# Patient Record
Sex: Female | Born: 1950 | Race: Black or African American | Hispanic: No | Marital: Married | Smoking: Former smoker
Health system: Southern US, Community
[De-identification: ages and names within clinical notes are randomized; demographics above are authoritative.]

## PROBLEM LIST (undated history)

## (undated) DIAGNOSIS — E119 Type 2 diabetes mellitus without complications: Secondary | ICD-10-CM

## (undated) DIAGNOSIS — K219 Gastro-esophageal reflux disease without esophagitis: Secondary | ICD-10-CM

## (undated) DIAGNOSIS — J449 Chronic obstructive pulmonary disease, unspecified: Secondary | ICD-10-CM

## (undated) DIAGNOSIS — I10 Essential (primary) hypertension: Secondary | ICD-10-CM

## (undated) DIAGNOSIS — IMO0001 Reserved for inherently not codable concepts without codable children: Secondary | ICD-10-CM

## (undated) DIAGNOSIS — K289 Gastrojejunal ulcer, unspecified as acute or chronic, without hemorrhage or perforation: Secondary | ICD-10-CM

## (undated) HISTORY — PX: BREAST SURGERY: SHX581

---

## 2006-03-26 ENCOUNTER — Other Ambulatory Visit: Admission: RE | Admit: 2006-03-26 | Discharge: 2006-03-26 | Payer: Self-pay | Admitting: Gynecology

## 2007-08-25 ENCOUNTER — Other Ambulatory Visit: Admission: RE | Admit: 2007-08-25 | Discharge: 2007-08-25 | Payer: Self-pay | Admitting: Gynecology

## 2009-02-03 ENCOUNTER — Other Ambulatory Visit: Admission: RE | Admit: 2009-02-03 | Discharge: 2009-02-03 | Payer: Self-pay | Admitting: Gynecology

## 2009-02-03 ENCOUNTER — Ambulatory Visit: Payer: Self-pay | Admitting: Gynecology

## 2009-02-27 ENCOUNTER — Encounter: Payer: Self-pay | Admitting: Internal Medicine

## 2009-03-03 ENCOUNTER — Ambulatory Visit: Payer: Self-pay | Admitting: Internal Medicine

## 2009-03-03 DIAGNOSIS — I10 Essential (primary) hypertension: Secondary | ICD-10-CM | POA: Insufficient documentation

## 2009-03-03 DIAGNOSIS — J449 Chronic obstructive pulmonary disease, unspecified: Secondary | ICD-10-CM

## 2009-03-03 DIAGNOSIS — J45909 Unspecified asthma, uncomplicated: Secondary | ICD-10-CM | POA: Insufficient documentation

## 2009-03-03 DIAGNOSIS — J4489 Other specified chronic obstructive pulmonary disease: Secondary | ICD-10-CM | POA: Insufficient documentation

## 2009-03-29 ENCOUNTER — Ambulatory Visit: Payer: Self-pay | Admitting: Gynecology

## 2009-04-07 ENCOUNTER — Ambulatory Visit: Payer: Self-pay | Admitting: Internal Medicine

## 2009-07-12 ENCOUNTER — Encounter: Admission: RE | Admit: 2009-07-12 | Discharge: 2009-07-12 | Payer: Self-pay | Admitting: *Deleted

## 2010-02-06 NOTE — Miscellaneous (Signed)
Summary: Orders Update pft charges  Clinical Lists Changes  Orders: Added new Service order of Carbon Monoxide diffusing w/capacity (94720) - Signed Added new Service order of Lung Volumes (94240) - Signed Added new Service order of Spirometry (Pre & Post) (94060) - Signed 

## 2010-02-06 NOTE — Assessment & Plan Note (Signed)
Summary: Pulmonary/ f/u pft's with hfa @ 90% effective now   Copy to:  [facility]  CC:  F/U with PFT - SOB is much better - Denies cough - Sleeping better at hs .  History of Present Illness: 60 yobf quit smoking Feb 19 2008 due to shortness of breath and dx copd based by [facility] DC doctor 2005 with best days able to do treadmill until January 2011  March 03, 2009 cc worse breathing x 1 month to [facility] Urgent care and got shots, abx and albuterol nebulizer some better then worse, went back and repeated rx some better but problem waking up early am with fatigue cough and congestion and marked decrease activity tol to point where sob across parking lot now.   Stop pulmicort (budesonide) only use nebulizer with albuterol if needed but if needed could use it up to every 4 hours start dulera 200 2 puffs first thing  in am and 2 puffs again in pm about 12 hours later   April 07, 2009 F/U with PFT - SOB is much better - Denies cough - Sleeping better at hs. Pt denies any significant sore throat, dysphagia, itching, sneezing,  nasal congestion or excess secretions,  fever, chills, sweats, unintended wt loss, pleuritic or exertional cp, hempoptysis, change in activity tolerance  orthopnea pnd or leg swelling. Pt also denies any obvious fluctuation in symptoms with weather or environmental change or other alleviating or aggravating factors.     Pt denies any increase in rescue therapy over baseline, denies waking up needing it or having early am exacerbations of coughing/wheezing/ or dyspnea   Current Medications (verified): 1)  Hydrochlorothiazide 25 Mg Tabs (Hydrochlorothiazide) .Marland Kitchen.. 1 Once Daily 2)  Dulera 200-5 Mcg/act Aero (Mometasone Furo-Formoterol Fum) .... 2 Puffs First Thing  in Am and 2 Puffs Again in Pm About 12 Hours Later  Allergies (verified): No Known Drug Allergies  Past History:  Past Medical History:  Elisabeth Pigeon D     - HFA 50 % March 03, 2009  > 90%  April 07, 2009      - PFT's April 07, 2009 FEV1 1.38 (61%) ratio 46 with 10% better after B2 DLC0 52 > 89% corrected Hypertension  Vital Signs:  Patient profile:   60 year old female Height:      63 inches Weight:      176 pounds BMI:     31.29 O2 Sat:      96 % on Room air Temp:     97.7 degrees F oral Pulse rate:   83 / minute BP sitting:   136 / 84  (left arm) Cuff size:   regular  Vitals Entered By: Abigail Miyamoto RN (April 07, 2009 10:41 AM)  O2 Flow:  Room air  Physical Exam  Additional Exam:  amb pleasant mod obese wf nad  wt 177  March 03, 2009 > 176 April 07, 2009  HEENT mild turbinate edema.  Oropharynx no thrush or excess pnd or cobblestoning.  No JVD or cervical adenopathy. Mild accessory muscle hypertrophy. Trachea midline, nl thryroid. Chest was hyperinflated by percussion with diminished breath sounds and moderate increased exp time without wheeze. Hoover sign positive at mid inspiration. Regular rate and rhythm without murmur gallop or rub or increase P2 or edema.  Abd: no hsm, nl excursion. Ext warm without cyanosis or clubbing.     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  I spent extra time with the patient today explaining optimal mdi  technique.  This improved from  75 - 90%   Each maintenance medication was reviewed in detail including most importantly the difference between maintenance and as needed and under what circumstances the prns are to be used. See instructions for specific recommendations   Other Orders: Est. Patient Level III ([address]) HFA Instruction [phone])  Patient Instructions: 1)   for cough is called mucinex dm otc 2)  Continue dulera 200 2 puffs first thing  in am and 2 puffs again in  pm about 12 hours later if needed  3)  Return to office in 3 months, sooner if needed  Prescriptions: DULERA 200-5 MCG/ACT AERO (MOMETASONE FURO-FORMOTEROL FUM) 2 puffs first thing  in am and 2 puffs again in pm about 12 hours later  #1 x 11   Entered and Authorized by:   Nyoka Cowden MD   Signed by:   Nyoka Cowden MD on 04/08/2009   Method used:   Electronically to        [facility] [address] [phone]* (retail)       [address]       [place], [place]  [address]       Ph: [phone] or [phone]       Fax: [phone]   RxID:   [id number]

## 2010-02-06 NOTE — Letter (Signed)
Summary: Medication Hx/[facility]  Medication Hx/[facility] @ [facility]   Imported By: Sherian Rein 03/10/2009 08:12:48  _____________________________________________________________________  External Attachment:    Type:   Image     Comment:   External Document

## 2010-02-06 NOTE — Letter (Signed)
Summary: [facility]   Imported By: Sherian Rein 03/10/2009 08:15:00  _____________________________________________________________________  External Attachment:    Type:   Image     Comment:   External Document

## 2010-02-06 NOTE — Assessment & Plan Note (Signed)
Summary: Pulmonary consultation/  COPD with AB,  HFA 50% effective   Visit Type:  Initial Consult Copy to:  [facility]  CC:  Dyspnea.  History of Present Illness: 41 yowf quit smoking Feb 17 2008 due to shortness of breath and dx copd based by [facility] DC doctor 2005 with best days able to do treadmill until January 2011  March 03, 2009 cc worse breathing x 1 month to [facility] Urgent care and got shots, abx and albuterol nebulizer some better then worse, went back and repeated rx some better but problem waking up early am with fatigue cough and congestion and marked decrease activity tol to point where sob across parking lot now.   Pt denies any significant sore throat, dysphagia, itching, sneezing,  nasal congestion or excess secretions,  fever, chills, sweats, unintended wt loss, pleuritic or exertional cp, hempoptysis, change in activity tolerance  orthopnea pnd or leg swelling. Pt also denies any obvious fluctuation in symptoms with weather or environmental change or other alleviating or aggravating factors.    Pt denies any increase in rescue therapy over baseline, denies waking up needing it or having early am exacerbations of coughing/wheezing/ or dyspnea   Current Medications (verified): 1)  Medrol (Pak) 4 Mg Tabs (Methylprednisolone) .... As Directed 2)  Hydrochlorothiazide 25 Mg Tabs (Hydrochlorothiazide) .Marland Kitchen.. 1 Once Daily  Allergies (verified): No Known Drug Allergies  Past History:  Past Medical History:  C Erskin Burnet D     - HFA 50 % March 03, 2009  Hypertension  Social History: Married with children Current smoker since age 66.  Smokes 1 ppd No ETOH Unemployed  Review of Systems       The patient complains of shortness of breath with activity, shortness of breath at rest, productive cough, irregular heartbeats, acid heartburn, sneezing, itching, and joint stiffness or pain.  The patient denies non-productive cough, coughing up blood, chest pain,  indigestion, loss of appetite, weight change, abdominal pain, difficulty swallowing, sore throat, tooth/dental problems, headaches, nasal congestion/difficulty breathing through nose, ear ache, anxiety, depression, hand/feet swelling, rash, change in color of mucus, and fever.    Vital Signs:  Patient profile:   60 year old female Height:      63 inches Weight:      177.13 pounds BMI:     31.49 O2 Sat:      95 % on Room air Temp:     97.8 degrees F oral Pulse rate:   104 / minute BP sitting:   132 / 90  (left arm)  Vitals Entered By: Vernie Murders (March 03, 2009 11:40 AM)  O2 Flow:  Room air  Physical Exam  Additional Exam:  amb pleasant mod obese wf nad  wt 177  March 03, 2009  HEENT mild turbinate edema.  Oropharynx no thrush or excess pnd or cobblestoning.  No JVD or cervical adenopathy. Mild accessory muscle hypertrophy. Trachea midline, nl thryroid. Chest was hyperinflated by percussion with diminished breath sounds and moderate increased exp time without wheeze. Hoover sign positive at mid inspiration. Regular rate and rhythm without murmur gallop or rub or increase P2 or edema.  Abd: no hsm, nl excursion. Ext warm without cyanosis or clubbing.     Impression & Recommendations:  Problem # 1:  C O P D (ICD-496) Baseline ex tol excellent so this must be copd with sign AB component and should improve with aggressive rx    DDX of  difficult airways managment all start  with A and  include Adherence, Ace Inhibitors, Acid Reflux, Active Sinus Disease, Alpha 1 Antitripsin deficiency, Anxiety masquerading as Airways dz,  ABPA,  allergy(esp in young), Aspiration (esp in elderly), Adverse effects of DPI,  Active smokers, plus one B  = Beta blocker use..   Assures me active smoking resolved so rx asthma with dulera and Acid Reflux with Nexium and diet until firm baseline functional status and FEV1 reviewed on next ov.  I spent extra time with the patient today explaining optimal  mdi  technique.  This improved from  25-50% but needs more work.  Medications Added to Medication List This Visit: 1)  Medrol (pak) 4 Mg Tabs (Methylprednisolone) .... As directed 2)  Hydrochlorothiazide 25 Mg Tabs (Hydrochlorothiazide) .Marland Kitchen.. 1 once daily 3)  Nexium 40 Mg Cpdr (Esomeprazole magnesium) .... By mouth daily. take one half hour before eating. 4)  Dulera 200-5 Mcg/act Aero (Mometasone furo-formoterol fum) .... 2 puffs first thing  in am and 2 puffs again in pm about 12 hours later 5)  Pepcid Ac Maximum Strength 20 Mg Tabs (Famotidine) .... One at bedtime  Other Orders: Consultation Level V [phone])  Patient Instructions: 1)  Stop pulmicort (budesonide) 2)  only use nebulizer with albuterol if needed but if needed could use it up to every 4 hours 3)  start dulera 200 2 puffs first thing  in am and 2 puffs again in pm about 12 hours later  4)  Work on inhaler technique:  relax and blow all the way out then take a nice smooth deep breath back in, triggering the inhaler at same time you start breathing in and hold it for a few seconds 5)  start nexium before bfast every day whether you think you need it or not plus pepcid 20 mg at bedtime 6)  GERD (REFLUX)  is a common cause of respiratory symptoms. It commonly presents without heartburn and can be treated with medication, but also with lifestyle changes including avoidance of late meals, excessive alcohol, smoking cessation, and avoid fatty foods, chocolate, peppermint, colas, red wine, and acidic juices such as orange juice. NO MINT OR MENTHOL PRODUCTS SO NO COUGH DROPS  7)  USE SUGARLESS CANDY INSTEAD (jolley ranchers)  8)  NO OIL BASED VITAMINS  9)  Please schedule a follow-up appointment in 4weeks, sooner if needed with PFT's  10)  for cough is called mucinex dm otc Prescriptions: NEXIUM 40 MG  CPDR (ESOMEPRAZOLE MAGNESIUM) By mouth daily. Take one half hour before eating.  #30 x 3   Entered and Authorized by:   Nyoka Cowden  MD   Signed by:   Nyoka Cowden MD on 03/03/2009   Method used:   Electronically to        [facility]. [address] [phone]* (retail)       [address]       [place], [place]  [address]       Ph: [phone] or [phone]       Fax: [phone]   RxID:   [id number]

## 2010-07-17 ENCOUNTER — Other Ambulatory Visit: Payer: Self-pay

## 2010-07-17 ENCOUNTER — Other Ambulatory Visit (HOSPITAL_COMMUNITY)
Admission: RE | Admit: 2010-07-17 | Discharge: 2010-07-17 | Disposition: A | Discharge status: Home / Self Care | Payer: Federal, State, Local not specified - PPO | Source: Ambulatory Visit | Attending: Internal Medicine | Admitting: Internal Medicine

## 2010-07-17 DIAGNOSIS — Z01419 Encounter for gynecological examination (general) (routine) without abnormal findings: Secondary | ICD-10-CM | POA: Insufficient documentation

## 2012-02-27 ENCOUNTER — Other Ambulatory Visit: Payer: Self-pay | Admitting: Family Medicine

## 2012-02-27 ENCOUNTER — Other Ambulatory Visit (HOSPITAL_COMMUNITY)
Admission: RE | Admit: 2012-02-27 | Discharge: 2012-02-27 | Disposition: A | Discharge status: Home / Self Care | Payer: Federal, State, Local not specified - PPO | Source: Ambulatory Visit | Attending: Family Medicine | Admitting: Family Medicine

## 2012-02-27 DIAGNOSIS — Z0000 Encounter for general adult medical examination without abnormal findings: Secondary | ICD-10-CM | POA: Insufficient documentation

## 2014-08-14 ENCOUNTER — Emergency Department (HOSPITAL_COMMUNITY): Payer: Federal, State, Local not specified - PPO

## 2014-08-14 ENCOUNTER — Encounter (HOSPITAL_COMMUNITY): Payer: Self-pay | Admitting: *Deleted

## 2014-08-14 ENCOUNTER — Emergency Department (HOSPITAL_COMMUNITY)
Admission: EM | Admit: 2014-08-14 | Discharge: 2014-08-14 | Disposition: A | Discharge status: Home / Self Care | Payer: Federal, State, Local not specified - PPO | Attending: Emergency Medicine | Admitting: Emergency Medicine

## 2014-08-14 DIAGNOSIS — R1011 Right upper quadrant pain: Secondary | ICD-10-CM | POA: Diagnosis present

## 2014-08-14 DIAGNOSIS — K8020 Calculus of gallbladder without cholecystitis without obstruction: Secondary | ICD-10-CM | POA: Diagnosis not present

## 2014-08-14 DIAGNOSIS — I10 Essential (primary) hypertension: Secondary | ICD-10-CM | POA: Insufficient documentation

## 2014-08-14 DIAGNOSIS — J449 Chronic obstructive pulmonary disease, unspecified: Secondary | ICD-10-CM | POA: Diagnosis not present

## 2014-08-14 LAB — COMPREHENSIVE METABOLIC PANEL
ALT: 15 U/L (ref 14–54)
AST: 19 U/L (ref 15–41)
Albumin: 3.7 g/dL (ref 3.5–5.0)
Alkaline Phosphatase: 75 U/L (ref 38–126)
Anion gap: 9 (ref 5–15)
BUN: 12 mg/dL (ref 6–20)
CO2: 33 mmol/L — ABNORMAL HIGH (ref 22–32)
Calcium: 9.7 mg/dL (ref 8.9–10.3)
Chloride: 98 mmol/L — ABNORMAL LOW (ref 101–111)
Creatinine, Ser: 0.93 mg/dL (ref 0.44–1.00)
GFR calc Af Amer: >60 mL/min (ref >60)
GFR calc non Af Amer: >60 mL/min (ref >60)
Glucose, Bld: 130 mg/dL — ABNORMAL HIGH (ref 65–99)
Potassium: 3.9 mmol/L (ref 3.5–5.1)
Sodium: 140 mmol/L (ref 135–145)
Total Bilirubin: 0.3 mg/dL (ref 0.3–1.2)
Total Protein: 7.6 g/dL (ref 6.5–8.1)

## 2014-08-14 LAB — URINALYSIS, ROUTINE W REFLEX MICROSCOPIC
Bilirubin Urine: NEGATIVE
Glucose, UA: NEGATIVE mg/dL
Hgb urine dipstick: NEGATIVE
Ketones, ur: NEGATIVE mg/dL
Nitrite: NEGATIVE
Protein, ur: NEGATIVE mg/dL
Specific Gravity, Urine: 1.012 (ref 1.005–1.030)
Urobilinogen, UA: 0.2 mg/dL (ref 0.0–1.0)
pH: 5.5 (ref 5.0–8.0)

## 2014-08-14 LAB — CBC
HCT: 39.4 % (ref 36.0–46.0)
Hemoglobin: 12.6 g/dL (ref 12.0–15.0)
MCH: 25.3 pg — ABNORMAL LOW (ref 26.0–34.0)
MCHC: 32 g/dL (ref 30.0–36.0)
MCV: 79 fL (ref 78.0–100.0)
Platelets: 355 10*3/uL (ref 150–400)
RBC: 4.99 MIL/uL (ref 3.87–5.11)
RDW: 15.1 % (ref 11.5–15.5)
WBC: 10.3 10*3/uL (ref 4.0–10.5)

## 2014-08-14 LAB — LIPASE, BLOOD: Lipase: 18 U/L — ABNORMAL LOW (ref 22–51)

## 2014-08-14 LAB — URINE MICROSCOPIC-ADD ON

## 2014-08-14 MED ORDER — PANTOPRAZOLE SODIUM 40 MG IV SOLR
40.0000 mg | Freq: Once | INTRAVENOUS | Status: AC
  Administered 2014-08-14: 40 mg via INTRAVENOUS
  Filled 2014-08-14: qty 40

## 2014-08-14 MED ORDER — ONDANSETRON 4 MG PO TBDP: 4.0000 mg | ORAL_TABLET | Freq: Three times a day (TID) | ORAL | Status: DC | PRN

## 2014-08-14 MED ORDER — OXYCODONE-ACETAMINOPHEN 5-325 MG PO TABS
1.0000 | ORAL_TABLET | Freq: Once | ORAL | Status: AC
  Administered 2014-08-14: 1 via ORAL
  Filled 2014-08-14: qty 1

## 2014-08-14 MED ORDER — IBUPROFEN 800 MG PO TABS: 800.0000 mg | ORAL_TABLET | Freq: Three times a day (TID) | ORAL | Status: DC

## 2014-08-14 MED ORDER — ONDANSETRON 4 MG PO TBDP
8.0000 mg | ORAL_TABLET | Freq: Once | ORAL | Status: AC
  Administered 2014-08-14: 8 mg via ORAL
  Filled 2014-08-14: qty 2

## 2014-08-14 NOTE — Discharge Instructions (Signed)
Take ibuprofen as needed for pain. Take zofran as needed for pain. Refer to attached documents for more information. Follow up with Dr. Carolynne Edouard for further evaluation.

## 2014-08-14 NOTE — ED Notes (Signed)
Asked pt to provide urine specimen pt stated she could not provide one at this time.  

## 2014-08-14 NOTE — ED Notes (Signed)
Patient transported to Ultrasound 

## 2014-08-14 NOTE — ED Notes (Signed)
The pt is c/o abd pain with nausea no vomiting.  Last bm today

## 2014-08-14 NOTE — ED Provider Notes (Signed)
CSN: [id number]     Arrival date & time 08/14/14  1756 History   First MD Initiated Contact with Patient 08/14/14 2016     Chief Complaint  Patient presents with  . Abdominal Pain     (Consider location/radiation/quality/duration/timing/severity/associated sxs/prior Treatment) HPI Comments: Patient is a 64 year old female with a past medical history of hypertension, asthma, and COPD who presents with abdominal pain that started earlier today. The pain is located in the RUQ and does not radiate. The pain is described as aching and severe. The pain started gradually and progressively worsened since the onset. No alleviating/aggravating factors. The patient has tried nothing for symptoms without relief. Associated symptoms include nausea. Patient denies fever, headache, vomiting, diarrhea, chest pain, SOB, dysuria, constipation. Patient denies any history of abdominal surgery. Patient reports having this symptoms previously and was diagnosed with an ulcer.      History reviewed. No pertinent past medical history. History reviewed. No pertinent past surgical history. No family history on file. History  Substance Use Topics  . Smoking status: Never Smoker   . Smokeless tobacco: Not on file  . Alcohol Use: No   OB History    No data available     Review of Systems  Gastrointestinal: Positive for vomiting and abdominal pain.  All other systems reviewed and are negative.     Allergies  Review of patient's allergies indicates no known allergies.  Home Medications   Prior to Admission medications   Not on File   BP 181/84 mmHg  Pulse 54  Temp(Src) 97.8 F (36.6 C)  Resp 18  Ht 5\' 3"  (1.6 m)  Wt 186 lb 1 oz (84.397 kg)  BMI 32.97 kg/m2  SpO2 97% Physical Exam  Constitutional: She is oriented to person, place, and time. She appears well-developed and well-nourished. No distress.  HENT:  Head: Normocephalic and atraumatic.  Eyes: Conjunctivae and EOM are normal.  Neck:  Normal range of motion.  Cardiovascular: Normal rate and regular rhythm.  Exam reveals no gallop and no friction rub.   No murmur heard. Pulmonary/Chest: Effort normal and breath sounds normal. She has no wheezes. She has no rales. She exhibits no tenderness.  Abdominal: Soft. She exhibits no distension. There is tenderness. There is no rebound and no guarding.  RUQ tenderness to palpation. No other focal tenderness.   Musculoskeletal: Normal range of motion.  Neurological: She is alert and oriented to person, place, and time. Coordination normal.  Speech is goal-oriented. Moves limbs without ataxia.   Skin: Skin is warm and dry.  Psychiatric: She has a normal mood and affect. Her behavior is normal.  Nursing note and vitals reviewed.   ED Course  Procedures (including critical care time) Labs Review Labs Reviewed  LIPASE, BLOOD - Abnormal; Notable for the following:    Lipase 18 (*)    All other components within normal limits  COMPREHENSIVE METABOLIC PANEL - Abnormal; Notable for the following:    Chloride 98 (*)    CO2 33 (*)    Glucose, Bld 130 (*)    All other components within normal limits  CBC - Abnormal; Notable for the following:    MCH 25.3 (*)    All other components within normal limits  URINALYSIS, ROUTINE W REFLEX MICROSCOPIC (NOT AT [facility]) - Abnormal; Notable for the following:    Leukocytes, UA TRACE (*)    All other components within normal limits  URINE MICROSCOPIC-ADD ON - Abnormal; Notable for the following:  Squamous Epithelial / LPF FEW (*)    All other components within normal limits    Imaging Review US Abdomen Complete  08/14/2014   CLINICAL DATA:  Upper abdominal pain  EXAM: ULTRASOUND ABDOMEN COMPLETE  COMPARISON:  None.  FINDINGS: Gallbladder: Within the gallbladder, there are multiple echogenic foci which move and shadow consistent with cholelithiasis. The largest individual gallstone measures 1.5 cm in length. There is no gallbladder wall  thickening or pericholecystic fluid. No sonographic Murphy sign noted.  Common bile duct: Diameter: 4 mm. There is no intrahepatic, common hepatic, or common bile duct dilatation.  Liver: No focal lesion identified. Within normal limits in parenchymal echogenicity.  IVC: No abnormality visualized.  Pancreas: No mass or inflammatory focus.  Spleen: Size and appearance within normal limits.  Right Kidney: Length: 9.8 cm. Echogenicity within normal limits. No hydronephrosis visualized. There is a 1.2 x 0.8 x 0.9 cm uniformly hyperechoic mass in the mid to lower pole right kidney consistent with a small angiomyolipoma.  Left Kidney: Length: 9.9 cm. Echogenicity within normal limits. No mass or hydronephrosis visualized.  Abdominal aorta: No aneurysm visualized.  Other findings: No demonstrable ascites.  IMPRESSION: Cholelithiasis.  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  Study otherwise unremarkable.   Electronically Signed   By: Bretta Bang III M.D.   On: 08/14/2014 23:04     EKG Interpretation None      MDM   Final diagnoses:  RUQ pain  Calculus of gallbladder without cholecystitis without obstruction    8:50 PM Labs unremarkable for acute changes. Patient will have RUQ [place]. Vitals stable and patient afebrile.   12:09 AM Patient's US shows gallstones without signs of cholecystitis. Patient will be discharged with general surgery follow up for possible elective cholecystectomy.   [address] [place], PA-C 08/15/14 0016  Raeford Razor, MD 08/21/14 1048

## 2014-11-24 ENCOUNTER — Other Ambulatory Visit (HOSPITAL_COMMUNITY)
Admission: RE | Admit: 2014-11-24 | Discharge: 2014-11-24 | Disposition: A | Discharge status: Home / Self Care | Payer: Federal, State, Local not specified - PPO | Source: Ambulatory Visit | Attending: Family Medicine | Admitting: Family Medicine

## 2014-11-24 ENCOUNTER — Other Ambulatory Visit: Payer: Self-pay | Admitting: Family Medicine

## 2014-11-24 DIAGNOSIS — Z01419 Encounter for gynecological examination (general) (routine) without abnormal findings: Secondary | ICD-10-CM | POA: Insufficient documentation

## 2014-11-28 LAB — CYTOLOGY - PAP

## 2015-01-18 ENCOUNTER — Other Ambulatory Visit: Payer: Self-pay | Admitting: Gastroenterology

## 2015-01-26 ENCOUNTER — Ambulatory Visit: Payer: Self-pay | Admitting: General Surgery

## 2015-01-26 NOTE — H&P (Signed)
History of Present Illness Malachi Carl MD; 01/26/2015 4:54 PM) The patient is a 65 year old female who presents for evaluation of gall stones. 88-year-old female back in with continued abdominal pain especially after fatty meals. Previously she does not want surgery, she has undergone additional evaluation with endoscopy without finding cause of her pain and they have recommended that she receive the surgeon. She does have occasional nausea and she does not have vomiting. She has changed her diet avoiding red meat, fried chicken, and ice cream as all of these things lead to this pain.   Allergies Fay Records, [place]; 01/26/2015 3:55 PM) No Known Drug Allergies08/18/2016  Medication History Fay Records, CMA; 01/26/2015 3:55 PM) Ibuprofen (  Tablet, Oral) Active. Medications Reconciled  Vitals Fay Records CMA; 01/26/2015 3:56 PM) 01/26/2015 3:55 PM Height: 63in Temp.: 98.46F(Temporal)  Pulse: 76 (Regular)  BP: 140/86 (Sitting, Left Arm, Standard)       Physical Exam  Addendum Note(Luke A. Kinsinger MD; 01/26/2015 4:58 PM) R: clear to auscultation bilaterally CV: Regular rate and rhythm Ab: soft, slight tenderness on palpation RUQ, no hernias or masses    Assessment & Plan Malachi Carl MD; 01/26/2015 4:56 PM) SYMPTOMATIC CHOLELITHIASIS (K80.20) Impression: 65 year old female with continued abdominal pain which I associate with gallstones. We discussed the etiology of the pain as well as pathology gallstones. She continues to seek for nonoperative management, however discussed the details of the procedure that there is no option to remove only the stones that there are no medications which we'll remove these symptoms in the long-term. We discussed the risks of the procedure terms of rare risk of CBD injury, bile duct leak, leading, liver injury, and bowel injury. We discussed possible diarrhea or loose stools after surgery as well as discomfort from incisions.  She she showed good understanding and has agreed to proceed with the surgery. Current Plans You are being scheduled for surgery - Our schedulers will call you.  You should hear from our office's scheduling department within 5 working days about the location, date, and time of surgery. We try to make accommodations for patient's preferences in scheduling surgery, but sometimes the OR schedule or the surgeon's schedule prevents [place] from making those accommodations.  If you have not heard from our office [phone]) in 5 working days, call the office and ask for your surgeon's nurse.  If you have other questions about your diagnosis, plan, or surgery, call the office and ask for your surgeon's nurse.  The anatomy & physiology of hepatobiliary & pancreatic function was discussed. The pathophysiology of gallbladder dysfunction was discussed. Natural history risks without surgery was discussed. I feel the risks of no intervention will lead to serious problems that outweigh the operative risks; therefore, I recommended cholecystectomy to remove the pathology. I explained laparoscopic techniques with possible need for an open approach. Probable cholangiogram to evaluate the bilary tract was explained as well.  Risks such as bleeding, infection, abscess, leak, injury to other organs, need for further treatment, heart attack, death, and other risks were discussed. I noted a good likelihood this will help address the problem. Possibility that this will not correct all abdominal symptoms was explained. Goals of post-operative recovery were discussed as well. We will work to minimize complications. An educational handout further explaining the pathology and treatment options was given as well. Questions were answered. The patient expresses understanding & wishes to proceed with surgery.

## 2015-05-31 ENCOUNTER — Ambulatory Visit: Payer: Self-pay | Admitting: General Surgery

## 2015-06-02 ENCOUNTER — Encounter (HOSPITAL_COMMUNITY)
Admission: RE | Admit: 2015-06-02 | Discharge: 2015-06-02 | Disposition: A | Discharge status: Home / Self Care | Payer: Federal, State, Local not specified - PPO | Source: Ambulatory Visit | Attending: General Surgery | Admitting: General Surgery

## 2015-06-02 ENCOUNTER — Encounter (HOSPITAL_COMMUNITY): Payer: Self-pay

## 2015-06-02 DIAGNOSIS — Z01812 Encounter for preprocedural laboratory examination: Secondary | ICD-10-CM | POA: Insufficient documentation

## 2015-06-02 DIAGNOSIS — I10 Essential (primary) hypertension: Secondary | ICD-10-CM | POA: Insufficient documentation

## 2015-06-02 DIAGNOSIS — Z01818 Encounter for other preprocedural examination: Secondary | ICD-10-CM | POA: Insufficient documentation

## 2015-06-02 DIAGNOSIS — R9431 Abnormal electrocardiogram [ECG] [EKG]: Secondary | ICD-10-CM | POA: Insufficient documentation

## 2015-06-02 DIAGNOSIS — K8020 Calculus of gallbladder without cholecystitis without obstruction: Secondary | ICD-10-CM | POA: Insufficient documentation

## 2015-06-02 HISTORY — DX: Gastro-esophageal reflux disease without esophagitis: K21.9

## 2015-06-02 HISTORY — DX: Chronic obstructive pulmonary disease, unspecified: J44.9

## 2015-06-02 HISTORY — DX: Essential (primary) hypertension: I10

## 2015-06-02 HISTORY — DX: Gastrojejunal ulcer, unspecified as acute or chronic, without hemorrhage or perforation: K28.9

## 2015-06-02 HISTORY — DX: Type 2 diabetes mellitus without complications: E11.9

## 2015-06-02 HISTORY — DX: Reserved for inherently not codable concepts without codable children: IMO0001

## 2015-06-02 LAB — BASIC METABOLIC PANEL
Anion gap: 7 (ref 5–15)
BUN: 12 mg/dL (ref 6–20)
CHLORIDE: 100 mmol/L — AB (ref 101–111)
CO2: 32 mmol/L (ref 22–32)
CREATININE: 0.87 mg/dL (ref 0.44–1.00)
Calcium: 9.4 mg/dL (ref 8.9–10.3)
GFR calc Af Amer: >60 mL/min (ref >60)
GFR calc non Af Amer: >60 mL/min (ref >60)
Glucose, Bld: 108 mg/dL — ABNORMAL HIGH (ref 65–99)
Potassium: 3.7 mmol/L (ref 3.5–5.1)
SODIUM: 139 mmol/L (ref 135–145)

## 2015-06-02 LAB — CBC
HEMATOCRIT: 39.8 % (ref 36.0–46.0)
HEMOGLOBIN: 12.5 g/dL (ref 12.0–15.0)
MCH: 25 pg — ABNORMAL LOW (ref 26.0–34.0)
MCHC: 31.4 g/dL (ref 30.0–36.0)
MCV: 79.4 fL (ref 78.0–100.0)
Platelets: 338 10*3/uL (ref 150–400)
RBC: 5.01 MIL/uL (ref 3.87–5.11)
RDW: 14.5 % (ref 11.5–15.5)
WBC: 6.7 10*3/uL (ref 4.0–10.5)

## 2015-06-02 LAB — GLUCOSE, CAPILLARY: Glucose-Capillary: 96 mg/dL (ref 65–99)

## 2015-06-02 MED ORDER — LIDOCAINE HCL (PF) 1 % IJ SOLN
INTRAMUSCULAR | Status: AC
  Filled 2015-06-02: qty 30

## 2015-06-02 MED ORDER — HEPARIN (PORCINE) IN NACL 2-0.9 UNIT/ML-% IJ SOLN
INTRAMUSCULAR | Status: AC
  Filled 2015-06-02: qty 1000

## 2015-06-02 MED ORDER — NITROGLYCERIN 1 MG/10 ML FOR IR/CATH LAB
INTRA_ARTERIAL | Status: AC
  Filled 2015-06-02: qty 10

## 2015-06-02 MED ORDER — BIVALIRUDIN 250 MG IV SOLR
INTRAVENOUS | Status: AC
  Filled 2015-06-02: qty 250

## 2015-06-02 MED ORDER — METOPROLOL TARTRATE 5 MG/5ML IV SOLN
INTRAVENOUS | Status: AC
  Filled 2015-06-02: qty 5

## 2015-06-02 MED ORDER — TICAGRELOR 90 MG PO TABS
ORAL_TABLET | ORAL | Status: AC
  Filled 2015-06-02: qty 1

## 2015-06-02 MED ORDER — FENTANYL CITRATE (PF) 100 MCG/2ML IJ SOLN
INTRAMUSCULAR | Status: AC
  Filled 2015-06-02: qty 2

## 2015-06-02 MED ORDER — HEPARIN (PORCINE) IN NACL 2-0.9 UNIT/ML-% IJ SOLN
INTRAMUSCULAR | Status: AC
  Filled 2015-06-02: qty 500

## 2015-06-02 MED ORDER — MIDAZOLAM HCL 2 MG/2ML IJ SOLN
INTRAMUSCULAR | Status: AC
  Filled 2015-06-02: qty 2

## 2015-06-02 MED ORDER — IOPAMIDOL (ISOVUE-370) INJECTION 76%
INTRAVENOUS | Status: AC
  Filled 2015-06-02: qty 100

## 2015-06-02 NOTE — Pre-Procedure Instructions (Signed)
Fleeta EmmerBrenda J Mcginn  06/02/2015      [facility] [id number] Ginette Otto- [place], [place] - [address] [facility] [address] [facility] [address]ia Belt[address] [place] [id number] Phone: [phone] Fax: [phone]    Your procedure is scheduled on    Tuesday  06/06/15  [facility] at 800 A.M.  Call this number if you have problems the morning of surgery:  [phone]   Remember:  Do not eat food or drink liquids after midnight.  Take these medicines the morning of surgery with A SIP OF WATER     ALBUTEROL, DULERA   (STOP MULTIVITAMIN, OMEGA3 FISH OIL, NO ASPIRIN OR ASPIRIN PRODUCTS, IBUPROFEN/ ADVIL/ MOTRIN, ALEVE, GOODY POWDERS, BCS, HERBAL MEDICINES)   Do not wear jewelry, make-up or nail polish.  Do not wear lotions, powders, or perfumes.  You may wear deodorant.  Do not shave 48 hours prior to surgery.  Men may shave face and neck.  Do not bring valuables to the hospital.  [facility] is not [facility] any belongings or valuables.  Contacts, dentures or bridgework may not be worn into surgery.  Leave your suitcase in the car.  After surgery it may be brought to your room.  For patients admitted to the hospital, discharge time will be determined by your treatment team.  Patients discharged the day of surgery will not be allowed to drive home.   Name and phone number of your driver:    Special instructions:  [place] - Preparing for Surgery  Before surgery, you can play an important role.  Because skin is not sterile, your skin needs to be as free of germs as possible.  You can reduce the number of germs on you skin by washing with CHG (chlorahexidine gluconate) soap before surgery.  CHG is an antiseptic cleaner which kills germs and bonds with the skin to continue killing germs even after washing.  Please DO NOT use if you have an allergy to CHG or antibacterial soaps.  If your skin becomes reddened/irritated stop using the CHG and inform your  nurse when you arrive at Short Stay.  Do not shave (including legs and underarms) for at least 48 hours prior to the first CHG shower.  You may shave your face.  Please follow these instructions carefully:   1.  Shower with CHG Soap the night before surgery and the                                morning of Surgery.  2.  If you choose to wash your hair, wash your hair first as usual with your       normal shampoo.  3.  After you shampoo, rinse your hair and body thoroughly to remove the                      Shampoo.  4.  Use CHG as you would any other liquid soap.  You can apply chg directly       to the skin and wash gently with scrungie or a clean washcloth.  5.  Apply the CHG Soap to your body ONLY FROM THE NECK DOWN.        Do not use on open wounds or open sores.  Avoid contact with your eyes,       ears, mouth and genitals (  private parts).  Wash genitals (private parts)       with your normal soap.  6.  Wash thoroughly, paying special attention to the area where your surgery        will be performed.  7.  Thoroughly rinse your body with warm water from the neck down.  8.  DO NOT shower/wash with your normal soap after using and rinsing off       the CHG Soap.  9.  Pat yourself dry with a clean towel.            10.  Wear clean pajamas.            11.  Place clean sheets on your bed the night of your first shower and do not        sleep with pets.  Day of Surgery  Do not apply any lotions/deoderants the morning of surgery.  Please wear clean clothes to the hospital/surgery center.    Please read over the following fact sheets that you were given. Pain Booklet, Coughing and Deep Breathing and Surgical Site Infection Prevention

## 2015-06-03 LAB — HEMOGLOBIN A1C
HEMOGLOBIN A1C: 6.6 % — AB (ref 4.8–5.6)
MEAN PLASMA GLUCOSE: 143 mg/dL

## 2015-06-06 ENCOUNTER — Encounter (HOSPITAL_COMMUNITY): Payer: Self-pay | Admitting: *Deleted

## 2015-06-06 ENCOUNTER — Ambulatory Visit (HOSPITAL_COMMUNITY)
Admission: RE | Admit: 2015-06-06 | Discharge: 2015-06-06 | Disposition: A | Discharge status: Home / Self Care | Payer: Federal, State, Local not specified - PPO | Source: Ambulatory Visit | Attending: General Surgery | Admitting: General Surgery

## 2015-06-06 ENCOUNTER — Ambulatory Visit (HOSPITAL_COMMUNITY): Payer: Federal, State, Local not specified - PPO | Admitting: Anesthesiology

## 2015-06-06 ENCOUNTER — Encounter (HOSPITAL_COMMUNITY)
Admission: RE | Disposition: A | Discharge status: Home / Self Care | Payer: Self-pay | Source: Ambulatory Visit | Attending: General Surgery

## 2015-06-06 DIAGNOSIS — I10 Essential (primary) hypertension: Secondary | ICD-10-CM | POA: Diagnosis not present

## 2015-06-06 DIAGNOSIS — J45909 Unspecified asthma, uncomplicated: Secondary | ICD-10-CM | POA: Diagnosis not present

## 2015-06-06 DIAGNOSIS — Z87891 Personal history of nicotine dependence: Secondary | ICD-10-CM | POA: Diagnosis not present

## 2015-06-06 DIAGNOSIS — K219 Gastro-esophageal reflux disease without esophagitis: Secondary | ICD-10-CM | POA: Insufficient documentation

## 2015-06-06 DIAGNOSIS — J449 Chronic obstructive pulmonary disease, unspecified: Secondary | ICD-10-CM | POA: Insufficient documentation

## 2015-06-06 DIAGNOSIS — K8010 Calculus of gallbladder with chronic cholecystitis without obstruction: Secondary | ICD-10-CM | POA: Insufficient documentation

## 2015-06-06 DIAGNOSIS — K7581 Nonalcoholic steatohepatitis (NASH): Secondary | ICD-10-CM | POA: Diagnosis not present

## 2015-06-06 DIAGNOSIS — E119 Type 2 diabetes mellitus without complications: Secondary | ICD-10-CM | POA: Insufficient documentation

## 2015-06-06 HISTORY — PX: CHOLECYSTECTOMY: SHX55

## 2015-06-06 LAB — GLUCOSE, CAPILLARY
Glucose-Capillary: 111 mg/dL — ABNORMAL HIGH (ref 65–99)
Glucose-Capillary: 176 mg/dL — ABNORMAL HIGH (ref 65–99)

## 2015-06-06 SURGERY — LAPAROSCOPIC CHOLECYSTECTOMY
Anesthesia: General | Site: Abdomen

## 2015-06-06 MED ORDER — ARTIFICIAL TEARS OP OINT
TOPICAL_OINTMENT | OPHTHALMIC | Status: DC | PRN
  Administered 2015-06-06: 1 via OPHTHALMIC

## 2015-06-06 MED ORDER — FENTANYL CITRATE (PF) 100 MCG/2ML IJ SOLN
INTRAMUSCULAR | Status: DC | PRN
  Administered 2015-06-06: 50 ug via INTRAVENOUS
  Administered 2015-06-06: 100 ug via INTRAVENOUS

## 2015-06-06 MED ORDER — SODIUM CHLORIDE 0.9 % IR SOLN
Status: DC | PRN
  Administered 2015-06-06: 1

## 2015-06-06 MED ORDER — DEXAMETHASONE SODIUM PHOSPHATE 10 MG/ML IJ SOLN
INTRAMUSCULAR | Status: DC | PRN
  Administered 2015-06-06: 5 mg via INTRAVENOUS

## 2015-06-06 MED ORDER — LIDOCAINE 2% (20 MG/ML) 5 ML SYRINGE
INTRAMUSCULAR | Status: AC
  Filled 2015-06-06: qty 5

## 2015-06-06 MED ORDER — BUPIVACAINE-EPINEPHRINE 0.25% -1:200000 IJ SOLN
INTRAMUSCULAR | Status: DC | PRN
  Administered 2015-06-06: 20 mL

## 2015-06-06 MED ORDER — ARTIFICIAL TEARS OP OINT
TOPICAL_OINTMENT | OPHTHALMIC | Status: AC
  Filled 2015-06-06: qty 3.5

## 2015-06-06 MED ORDER — MIDAZOLAM HCL 5 MG/5ML IJ SOLN
INTRAMUSCULAR | Status: DC | PRN
  Administered 2015-06-06 (×2): 1 mg via INTRAVENOUS

## 2015-06-06 MED ORDER — CEFOTETAN DISODIUM-DEXTROSE 2-2.08 GM-% IV SOLR
2.0000 g | INTRAVENOUS | Status: AC
Start: 2015-06-06 — End: 2015-06-06
  Administered 2015-06-06: 2 g via INTRAVENOUS
  Filled 2015-06-06: qty 50

## 2015-06-06 MED ORDER — HYDROMORPHONE HCL 1 MG/ML IJ SOLN
INTRAMUSCULAR | Status: AC
  Filled 2015-06-06: qty 1

## 2015-06-06 MED ORDER — PHENYLEPHRINE HCL 10 MG/ML IJ SOLN
INTRAMUSCULAR | Status: DC | PRN
  Administered 2015-06-06: 40 ug via INTRAVENOUS

## 2015-06-06 MED ORDER — PROPOFOL 10 MG/ML IV BOLUS
INTRAVENOUS | Status: DC | PRN
Start: 2015-06-06 — End: 2015-06-06
  Administered 2015-06-06: 170 mg via INTRAVENOUS

## 2015-06-06 MED ORDER — OXYCODONE HCL 5 MG PO TABS
ORAL_TABLET | ORAL | Status: AC
  Filled 2015-06-06: qty 1

## 2015-06-06 MED ORDER — ONDANSETRON HCL 4 MG/2ML IJ SOLN
INTRAMUSCULAR | Status: AC
  Filled 2015-06-06: qty 2

## 2015-06-06 MED ORDER — ROCURONIUM BROMIDE 50 MG/5ML IV SOLN
INTRAVENOUS | Status: AC
  Filled 2015-06-06: qty 1

## 2015-06-06 MED ORDER — SUGAMMADEX SODIUM 200 MG/2ML IV SOLN
INTRAVENOUS | Status: DC | PRN
  Administered 2015-06-06: 160 mg via INTRAVENOUS

## 2015-06-06 MED ORDER — BUPIVACAINE-EPINEPHRINE (PF) 0.25% -1:200000 IJ SOLN
INTRAMUSCULAR | Status: AC
  Filled 2015-06-06: qty 30

## 2015-06-06 MED ORDER — ONDANSETRON HCL 4 MG/2ML IJ SOLN
INTRAMUSCULAR | Status: DC | PRN
  Administered 2015-06-06: 4 mg via INTRAVENOUS

## 2015-06-06 MED ORDER — 0.9 % SODIUM CHLORIDE (POUR BTL) OPTIME
TOPICAL | Status: DC | PRN
  Administered 2015-06-06: 1000 mL

## 2015-06-06 MED ORDER — MIDAZOLAM HCL 2 MG/2ML IJ SOLN
INTRAMUSCULAR | Status: AC
  Filled 2015-06-06: qty 2

## 2015-06-06 MED ORDER — LACTATED RINGERS IV SOLN
INTRAVENOUS | Status: DC
  Administered 2015-06-06 (×3): via INTRAVENOUS

## 2015-06-06 MED ORDER — PHENYLEPHRINE 40 MCG/ML (10ML) SYRINGE FOR IV PUSH (FOR BLOOD PRESSURE SUPPORT)
PREFILLED_SYRINGE | INTRAVENOUS | Status: AC
  Filled 2015-06-06: qty 10

## 2015-06-06 MED ORDER — SUGAMMADEX SODIUM 200 MG/2ML IV SOLN
INTRAVENOUS | Status: AC
  Filled 2015-06-06: qty 2

## 2015-06-06 MED ORDER — HYDROMORPHONE HCL 1 MG/ML IJ SOLN
0.2500 mg | INTRAMUSCULAR | Status: DC | PRN
  Administered 2015-06-06: 0.25 mg via INTRAVENOUS

## 2015-06-06 MED ORDER — LIDOCAINE HCL (CARDIAC) 20 MG/ML IV SOLN
INTRAVENOUS | Status: DC | PRN
  Administered 2015-06-06: 40 mg via INTRAVENOUS

## 2015-06-06 MED ORDER — DEXAMETHASONE SODIUM PHOSPHATE 10 MG/ML IJ SOLN
INTRAMUSCULAR | Status: AC
  Filled 2015-06-06: qty 1

## 2015-06-06 MED ORDER — OXYCODONE HCL 5 MG PO TABS
5.0000 mg | ORAL_TABLET | Freq: Once | ORAL | Status: AC
  Administered 2015-06-06: 5 mg via ORAL

## 2015-06-06 MED ORDER — OXYCODONE-ACETAMINOPHEN 5-325 MG PO TABS: 1.0000 | ORAL_TABLET | ORAL | Status: AC | PRN

## 2015-06-06 MED ORDER — FENTANYL CITRATE (PF) 250 MCG/5ML IJ SOLN
INTRAMUSCULAR | Status: AC
  Filled 2015-06-06: qty 5

## 2015-06-06 MED ORDER — PROPOFOL 10 MG/ML IV BOLUS
INTRAVENOUS | Status: AC
  Filled 2015-06-06: qty 40

## 2015-06-06 MED ORDER — ROCURONIUM BROMIDE 100 MG/10ML IV SOLN
INTRAVENOUS | Status: DC | PRN
  Administered 2015-06-06: 50 mg via INTRAVENOUS

## 2015-06-06 MED ORDER — LIDOCAINE HCL 4 % EX SOLN
CUTANEOUS | Status: DC | PRN
  Administered 2015-06-06: 2 mL via TOPICAL

## 2015-06-06 SURGICAL SUPPLY — 38 items
BAG SPEC RTRVL 10 TROC 200 (ENDOMECHANICALS) ×1
BLADE SURG ROTATE 9660 (MISCELLANEOUS) IMPLANT
CANISTER SUCTION 2500CC (MISCELLANEOUS) ×2 IMPLANT
CHLORAPREP W/TINT 26ML (MISCELLANEOUS) ×2 IMPLANT
CLIP LIGATING HEMO LOK XL GOLD (MISCELLANEOUS) ×3 IMPLANT
COVER SURGICAL LIGHT HANDLE (MISCELLANEOUS) ×2 IMPLANT
DRESSING TELFA 8X10 (GAUZE/BANDAGES/DRESSINGS) ×1 IMPLANT
ELECT REM PT RETURN 9FT ADLT (ELECTROSURGICAL) ×2
ELECTRODE REM PT RTRN 9FT ADLT (ELECTROSURGICAL) ×1 IMPLANT
GLOVE BIOGEL PI IND STRL 7.0 (GLOVE) ×1 IMPLANT
GLOVE BIOGEL PI IND STRL 8.5 (GLOVE) IMPLANT
GLOVE BIOGEL PI INDICATOR 7.0 (GLOVE) ×2
GLOVE BIOGEL PI INDICATOR 8.5 (GLOVE) ×1
GLOVE SURG SS PI 7.0 STRL IVOR (GLOVE) ×3 IMPLANT
GLOVE SURG SS PI 8.0 STRL IVOR (GLOVE) ×1 IMPLANT
GOWN STRL REUS W/ TWL LRG LVL3 (GOWN DISPOSABLE) ×3 IMPLANT
GOWN STRL REUS W/TWL LRG LVL3 (GOWN DISPOSABLE) ×6
GRASPER SUT TROCAR 14GX15 (MISCELLANEOUS) ×2 IMPLANT
KIT BASIN OR (CUSTOM PROCEDURE TRAY) ×2 IMPLANT
KIT ROOM TURNOVER OR (KITS) ×2 IMPLANT
LIQUID BAND (GAUZE/BANDAGES/DRESSINGS) ×2 IMPLANT
NDL BIOPSY 14X6 SOFT TISS (NEEDLE) IMPLANT
NEEDLE BIOPSY 14X6 SOFT TISS (NEEDLE) ×2 IMPLANT
NS IRRIG 1000ML POUR BTL (IV SOLUTION) ×2 IMPLANT
PAD ARMBOARD 7.5X6 YLW CONV (MISCELLANEOUS) ×2 IMPLANT
POUCH RETRIEVAL ECOSAC 10 (ENDOMECHANICALS) ×1 IMPLANT
POUCH RETRIEVAL ECOSAC 10MM (ENDOMECHANICALS) ×1
SCISSORS LAP 5X35 DISP (ENDOMECHANICALS) ×2 IMPLANT
SET IRRIG TUBING LAPAROSCOPIC (IRRIGATION / IRRIGATOR) ×2 IMPLANT
SLEEVE ENDOPATH XCEL 5M (ENDOMECHANICALS) ×4 IMPLANT
SPECIMEN JAR SMALL (MISCELLANEOUS) ×2 IMPLANT
SUT MNCRL AB 4-0 PS2 18 (SUTURE) ×2 IMPLANT
TOWEL OR 17X24 6PK STRL BLUE (TOWEL DISPOSABLE) ×2 IMPLANT
TOWEL OR 17X26 10 PK STRL BLUE (TOWEL DISPOSABLE) ×2 IMPLANT
TRAY LAPAROSCOPIC MC (CUSTOM PROCEDURE TRAY) ×2 IMPLANT
TROCAR XCEL 12X100 BLDLESS (ENDOMECHANICALS) ×2 IMPLANT
TROCAR XCEL NON-BLD 5MMX100MML (ENDOMECHANICALS) ×2 IMPLANT
TUBING INSUFFLATION (TUBING) ×2 IMPLANT

## 2015-06-06 NOTE — Transfer of Care (Signed)
Immediate Anesthesia Transfer of Care Note  Patient: Sherry Roberts  Procedure(s) Performed: Procedure(s): LAPAROSCOPIC CHOLECYSTECTOMY (N/A)  Patient Location: PACU  Anesthesia Type:General  Level of Consciousness: awake, sedated, patient cooperative and responds to stimulation  Airway & Oxygen Therapy: Patient Spontanous Breathing  Post-op Assessment: Report given to RN  Post vital signs: Reviewed and stable  Last Vitals:  Filed Vitals:   06/06/15 0759  BP: 165/94  Pulse: 85  Temp: 36.8 C  Resp: 16    Last Pain: There were no vitals filed for this visit.    Patients Stated Pain Goal: 3 (06/06/15 0759)  Complications: No apparent anesthesia complications

## 2015-06-06 NOTE — Anesthesia Preprocedure Evaluation (Addendum)
Anesthesia Evaluation  Patient identified by MRN, date of birth, ID band Patient awake    Reviewed: Allergy & Precautions, NPO status , Patient's Chart, lab work & pertinent test results  Airway Mallampati: I  TM Distance: >3 FB Neck ROM: Full    Dental  (+) Teeth Intact, Dental Advisory Given   Pulmonary asthma , COPD,  COPD inhaler, former smoker,    Pulmonary exam normal        Cardiovascular hypertension, Pt. on medications Normal cardiovascular exam     Neuro/Psych negative neurological ROS     GI/Hepatic Neg liver ROS, PUD, GERD  ,  Endo/Other  diabetes, Type 2  Renal/GU negative Renal ROS     Musculoskeletal   Abdominal   Peds  Hematology negative hematology ROS (+)   Anesthesia Other Findings   Reproductive/Obstetrics                           Lab Results  Component Value Date   WBC 6.7 06/02/2015   HGB 12.5 06/02/2015   HCT 39.8 06/02/2015   MCV 79.4 06/02/2015   PLT 338 06/02/2015   Lab Results  Component Value Date   CREATININE 0.87 06/02/2015   BUN 12 06/02/2015   NA 139 06/02/2015   K 3.7 06/02/2015   CL 100* 06/02/2015   CO2 32 06/02/2015    Anesthesia Physical Anesthesia Plan  ASA: II  Anesthesia Plan: General   Post-op Pain Management:    Induction: Intravenous  Airway Management Planned: Oral ETT  Additional Equipment:   Intra-op Plan:   Post-operative Plan: Extubation in OR  Informed Consent: I have reviewed the patients History and Physical, chart, labs and discussed the procedure including the risks, benefits and alternatives for the proposed anesthesia with the patient or authorized representative who has indicated his/her understanding and acceptance.   Dental advisory given  Plan Discussed with: CRNA  Anesthesia Plan Comments:         Anesthesia Quick Evaluation

## 2015-06-06 NOTE — Progress Notes (Signed)
Care of pt assumed by MA Jakell Trusty RN 

## 2015-06-06 NOTE — H&P (Signed)
Fleeta EmmerBrenda J Steinhardt is an 65 y.o. female.   Chief Complaint: abdominal pain HPI: 65 yo female with intermittent RUQ and epigastric pain. On workup she was found to have 1.5cm gallstone without evidence of acute cholecystitis.  Past Medical History  Diagnosis Date  . Diabetes mellitus without complication (HCC)     BORDERLINE   . Hypertension   . COPD (chronic obstructive pulmonary disease) (HCC)   . Shortness of breath dyspnea     W/ EXERTION  RUNNING  . GERD (gastroesophageal reflux disease)   . Ulcer of the stomach and intestine     Past Surgical History  Procedure Laterality Date  . Breast surgery      LEFT BREAST BX  BENIGN     History reviewed. No pertinent family history. Social History:  reports that she has quit smoking. She does not have any smokeless tobacco history on file. She reports that she does not drink alcohol or use illicit drugs.  Allergies: No Known Allergies  Medications Prior to Admission  Medication Sig Dispense Refill  . albuterol (PROVENTIL HFA;VENTOLIN HFA) 108 (90 Base) MCG/ACT inhaler Inhale 2 puffs into the lungs daily as needed for wheezing or shortness of breath.    . cetaphil (CETAPHIL) cream Apply 1 application topically every morning.    . hydrochlorothiazide (HYDRODIURIL) 25 MG tablet Take 25 mg by mouth daily.   4  . mometasone-formoterol (DULERA) 100-5 MCG/ACT AERO Inhale 2 puffs into the lungs daily as needed for wheezing.    . Multiple Vitamins-Minerals (ONE-A-DAY 50 PLUS PO) Take 1 tablet by mouth daily.    . nicotine polacrilex (COMMIT) 2 MG lozenge Take 2 mg by mouth as needed for smoking cessation.    Ailene Ards. Omega Fatty Acids-Vitamins (OMEGA-3 GUMMIES PO) Take 500 mg by mouth daily.      Results for orders placed or performed during the hospital encounter of 06/06/15 (from the past 48 hour(s))  Glucose, capillary     Status: Abnormal   Collection Time: 06/06/15  8:04 AM  Result Value Ref Range   Glucose-Capillary 111 (H) 65 - 99 mg/dL   Comment 1 Notify RN    Comment 2 Document in Chart    No results found.  Review of Systems  Constitutional: Negative for fever and chills.  HENT: Negative for hearing loss.   Eyes: Negative for blurred vision and double vision.  Respiratory: Negative for cough and hemoptysis.   Cardiovascular: Negative for chest pain and palpitations.  Gastrointestinal: Positive for abdominal pain. Negative for nausea and vomiting.  Genitourinary: Negative for dysuria and urgency.  Musculoskeletal: Negative for myalgias and neck pain.  Skin: Negative for itching and rash.  Neurological: Negative for dizziness, tingling and headaches.  Endo/Heme/Allergies: Does not bruise/bleed easily.  Psychiatric/Behavioral: Negative for depression and suicidal ideas.    Blood pressure 165/94, pulse 85, temperature 98.2 F (36.8 C), temperature source Oral, resp. rate 16, SpO2 98 %. Physical Exam  Vitals reviewed. Constitutional: She is oriented to person, place, and time. She appears well-developed and well-nourished.  HENT:  Head: Normocephalic and atraumatic.  Eyes: Conjunctivae and EOM are normal. Pupils are equal, round, and reactive to light.  Neck: Normal range of motion. Neck supple.  Cardiovascular: Normal rate and regular rhythm.   Respiratory: Effort normal and breath sounds normal.  GI: Soft. Bowel sounds are normal. She exhibits no distension. There is no tenderness.  Musculoskeletal: Normal range of motion.  Neurological: She is alert and oriented to person, place, and time.  Skin: Skin is warm and dry.  Psychiatric: She has a normal mood and affect. Her behavior is normal.     Assessment/Plan 65 yo female with chronic cholecystitis -lap chole -preop abx -discussed details of the procedure and expected postop operative care. All questions were answered  Rodman Pickle, MD 06/06/2015, 8:55 AM

## 2015-06-06 NOTE — Anesthesia Procedure Notes (Signed)
Procedure Name: Intubation Date/Time: 06/06/2015 9:44 AM Performed by: Suzy Bouchard Pre-anesthesia Checklist: Emergency Drugs available, Suction available, Patient being monitored, Timeout performed and Patient identified Patient Re-evaluated:Patient Re-evaluated prior to inductionOxygen Delivery Method: Circle system utilized Preoxygenation: Pre-oxygenation with 100% oxygen Intubation Type: IV induction Ventilation: Mask ventilation without difficulty Laryngoscope Size: Mac and 3 Grade View: Grade II Tube type: Oral Tube size: 7.5 mm Number of attempts: 1 Airway Equipment and Method: Stylet and LTA kit utilized Placement Confirmation: ETT inserted through vocal cords under direct vision,  breath sounds checked- equal and bilateral and positive ETCO2 Secured at: 22 cm Tube secured with: Tape Dental Injury: Teeth and Oropharynx as per pre-operative assessment

## 2015-06-06 NOTE — Op Note (Signed)
PATIENT:  Sherry Roberts  65 y.o. female  PRE-OPERATIVE DIAGNOSIS:  chronic choleystitis  POST-OPERATIVE DIAGNOSIS:  chronic choleystitis  PROCEDURE:  Procedure(s): LAPAROSCOPIC CHOLECYSTECTOMY Liver biopsy  SURGEON:  Surgeon(s): Rodman PickleLuke Aaron Kinsinger, MD  ASSISTANT: none   ANESTHESIA:   local and general  Surgeon: Feliciana RossettiLuke Kinsinger, M.D.  Asst: none  Anesthesia: Gen.   Indications for procedure: Sherry Roberts is a 65 y.o. female with symptoms of RUQ pain and Nausea consistent with gallbladder disease, Confirmed by Ultrasound.  Description of procedure: The patient was brought into the operative suite, placed supine. Anesthesia was administered with endotracheal tube. Patient was strapped in place and foot board was secured. All pressure points were offloaded by foam padding. The patient was prepped and draped in the usual sterile fashion.  A small incision was made to the right of the umbilicus. A 5mm trocar was inserted into the peritoneal cavity with optical entry. Pneumoperitoneum was applied with high flow low pressure. 2 5mm trocars were placed in the RUQ. A 12mm trocar was placed in the subxiphoid space. All trocars sites were first anesthesized with 0.25% marcaine with epinephrine in the subcutaneous and preperitoneal layers. Next the patient was placed in reverse trendelenberg.   The gallbladder was retracted cephalad and lateral. The peritoneum was reflected off the infundibulum working lateral to medial. The cystic duct and cystic artery were identified and further dissection revealed a critical view. In dissecting a small hole in the gallbladder wall was made and multiple small stones suctioned out of the field.The cystic duct and cystic artery were doubly clipped and ligated.   The gallbladder was removed off the liver bed with cautery. The Gallbladder was placed in a specimen bag. The gallbladder fossa was irrigated and hemostasis was applied with cautery. The gallbladder  was removed via the 12mm trocar. No dilation was required for removal, therefore no fascial closure was performed. Pneumoperitoneum was removed, all trocar were removed. All incisions were closed with 4-0 monocryl subcuticular stitch. The patient woke from anesthesia and was brought to PACU in stable condition. All counts were correct  Findings: gallbladder with inflammation large stone  Specimen: gallbladder  Blood loss: Total I/O In: 1000 [I.V.:1000] Out: -  ml  Local anesthesia: 20 ml 0.25% marcaine with epinephrine  Complications: none  PLAN OF CARE: Discharge to home after PACU  PATIENT DISPOSITION:  PACU - hemodynamically stable.  Feliciana RossettiLuke Kinsinger, M.D. General, Bariatric, & Minimally Invasive Surgery [facility]

## 2015-06-07 NOTE — Anesthesia Postprocedure Evaluation (Signed)
Anesthesia Post Note  Patient: Sherry Roberts  Procedure(s) Performed: Procedure(s) (LRB): LAPAROSCOPIC CHOLECYSTECTOMY (N/A)  Patient location during evaluation: PACU Anesthesia Type: General Level of consciousness: awake and alert Pain management: pain level controlled Vital Signs Assessment: post-procedure vital signs reviewed and stable Respiratory status: spontaneous breathing, nonlabored ventilation, respiratory function stable and patient connected to nasal cannula oxygen Cardiovascular status: blood pressure returned to baseline and stable Postop Assessment: no signs of nausea or vomiting Anesthetic complications: no    Last Vitals:  Filed Vitals:   06/06/15 1215 06/06/15 1230  BP:  162/81  Pulse: 78 72  Temp: 36.1 C 36.5 C  Resp: 17 18    Last Pain:  Filed Vitals:   06/06/15 1255  PainSc: 3                  [name], [name]

## 2015-06-08 ENCOUNTER — Encounter (HOSPITAL_COMMUNITY): Payer: Self-pay | Admitting: General Surgery

## 2015-11-12 IMAGING — US US ABDOMEN COMPLETE
1 series · 13 of 25 positions shown · non-contrast
Comparison: None.

CLINICAL DATA: Upper abdominal pain

EXAM:
ULTRASOUND ABDOMEN COMPLETE

[Series 1: us abdomen complete · 0.24mm/px · 13 of 62 slices shown]
[im 1/62]
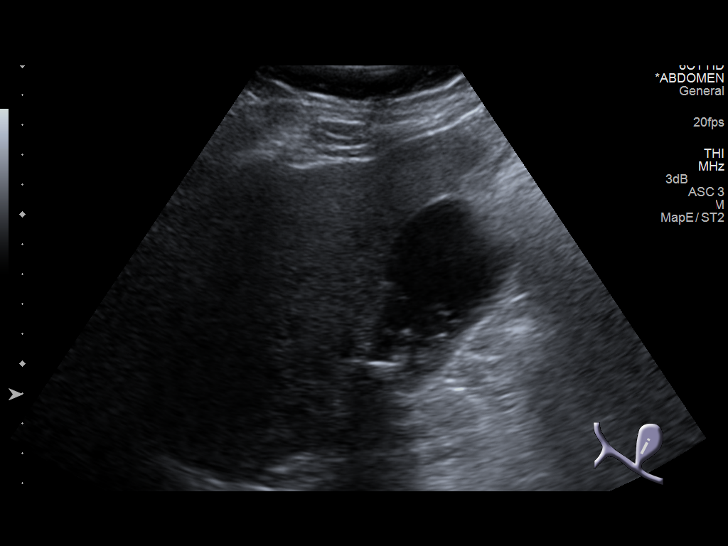
[im 6/62]
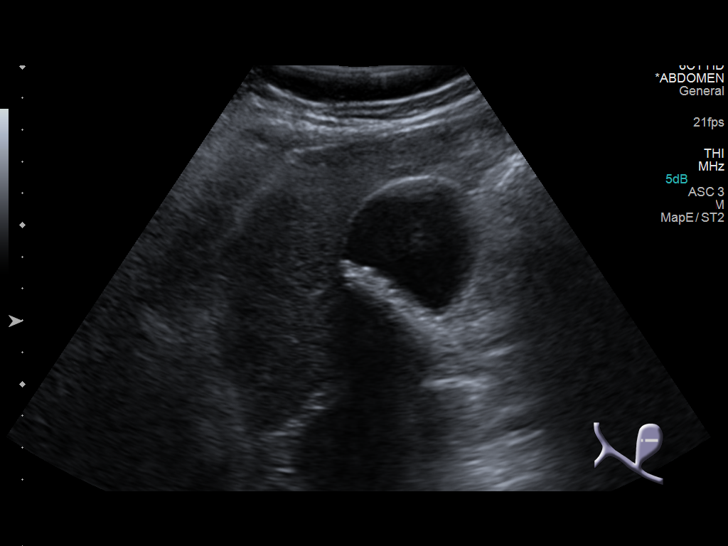
[im 11/62]
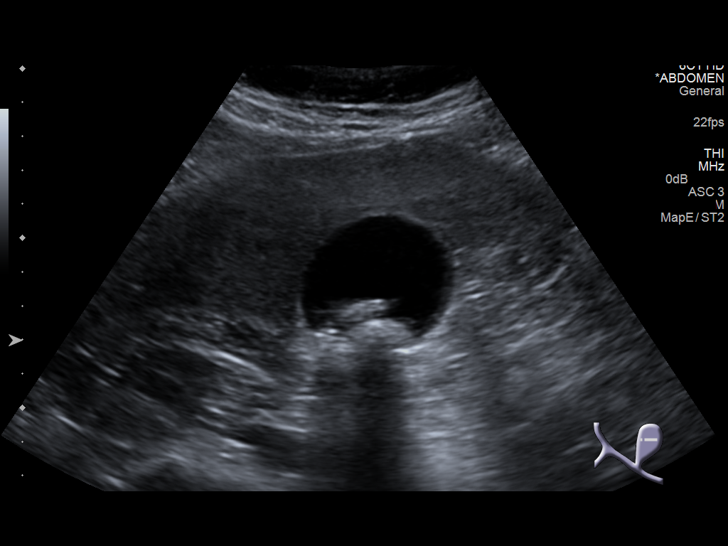
[im 16/62]
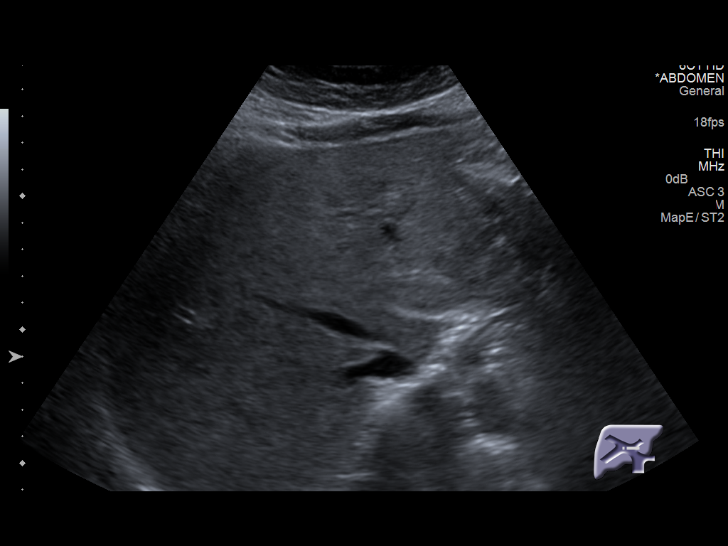
[im 21/62]
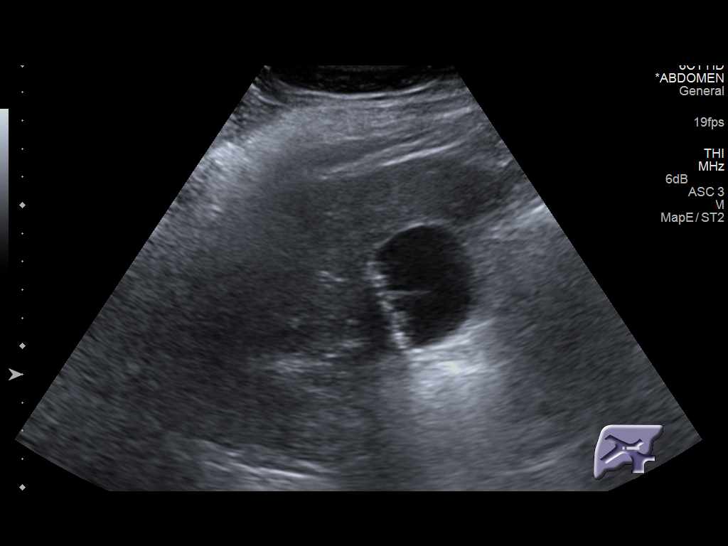
[im 26/62]
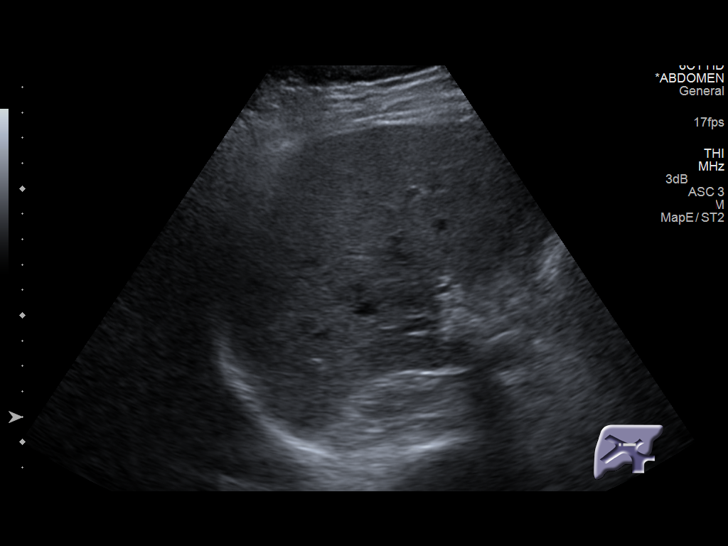
[im 31/62]
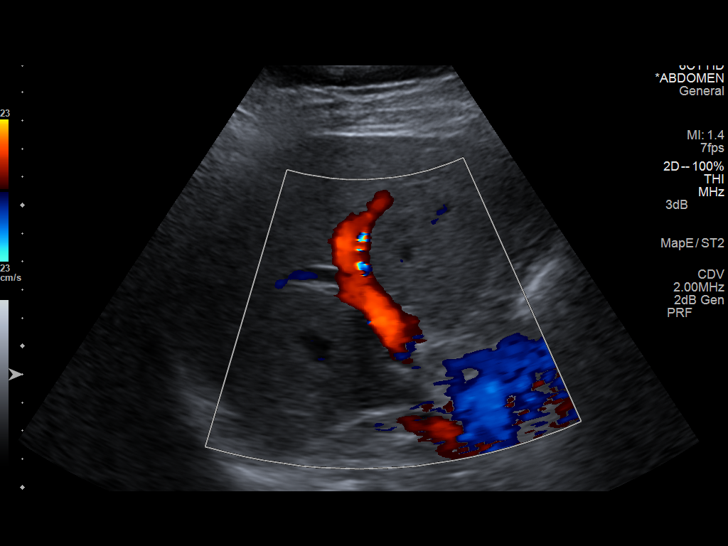
[im 36/62]
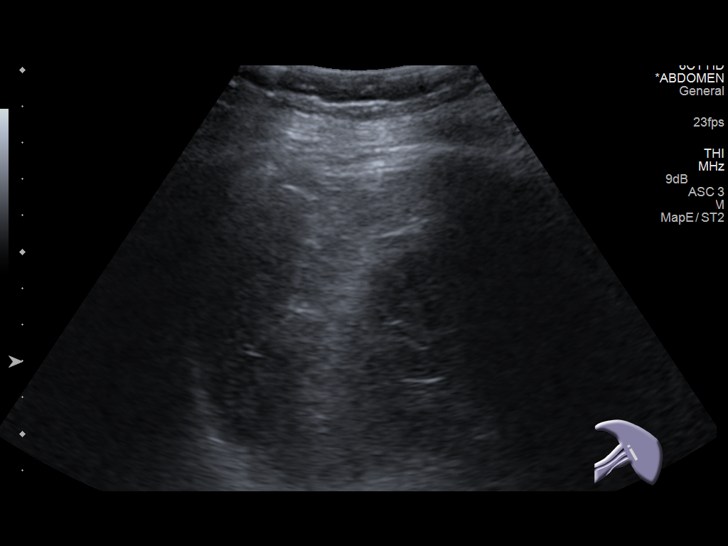
[im 41/62]
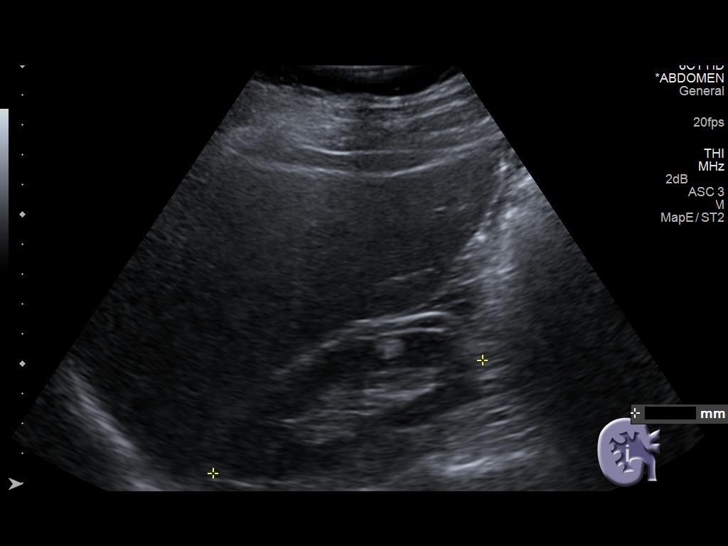
[im 46/62]
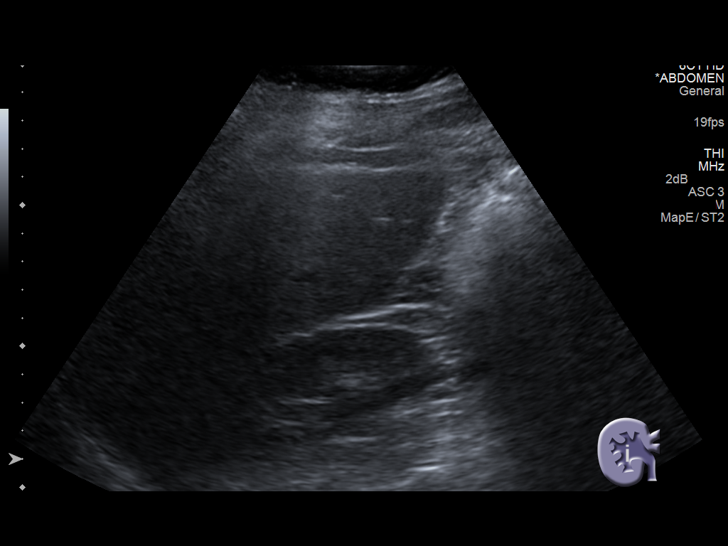
[im 51/62]
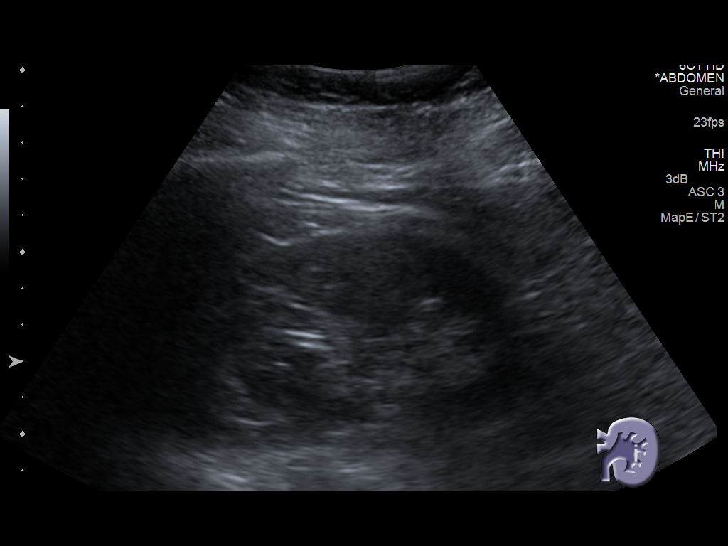
[im 56/62]
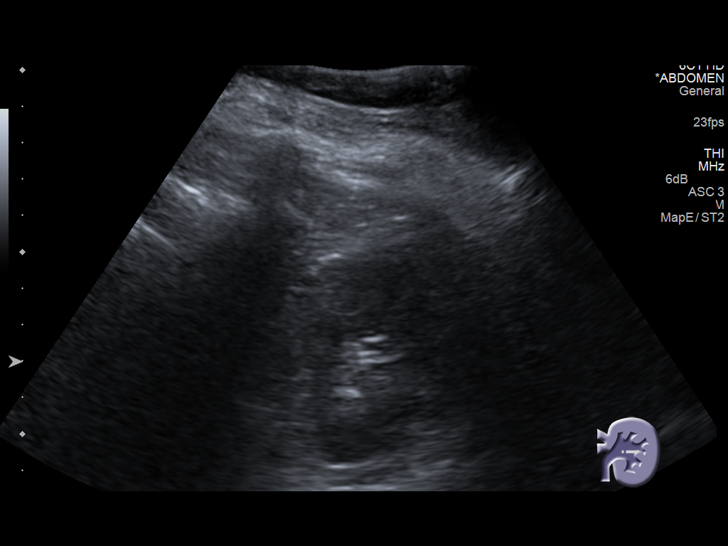
[im 62/62]
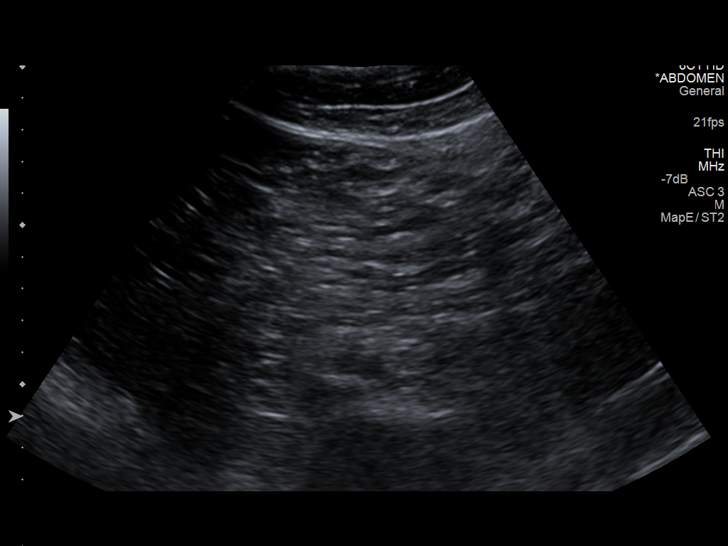

[13 of 25 positions shown; findings below may reference images not displayed]

FINDINGS: Gallbladder: Within the gallbladder, there are multiple echogenic
foci which move and shadow consistent with cholelithiasis. The
largest individual gallstone measures 1.5 cm in length. There is no
gallbladder wall thickening or pericholecystic fluid. No sonographic
Murphy sign noted.

Common bile duct: Diameter: 4 mm. There is no intrahepatic, common
hepatic, or common bile duct dilatation.

Liver: No focal lesion identified. Within normal limits in
parenchymal echogenicity.

IVC: No abnormality visualized.

Pancreas: No mass or inflammatory focus.

Spleen: Size and appearance within normal limits.

Right Kidney: Length: 9.8 cm. Echogenicity within normal limits. No
hydronephrosis visualized. There is a 1.2 x 0.8 x 0.9 cm uniformly
hyperechoic mass in the mid to lower pole right kidney consistent
with a small angiomyolipoma.

Left Kidney: Length: 9.9 cm. Echogenicity within normal limits. No
mass or hydronephrosis visualized.

Abdominal aorta: No aneurysm visualized.

Other findings: No demonstrable ascites.
IMPRESSION: Cholelithiasis.

Small angiomyolipoma in right kidney.

Study otherwise unremarkable.

## 2016-10-21 DIAGNOSIS — Z23 Encounter for immunization: Secondary | ICD-10-CM | POA: Diagnosis not present

## 2016-10-21 DIAGNOSIS — J4520 Mild intermittent asthma, uncomplicated: Secondary | ICD-10-CM | POA: Diagnosis not present

## 2016-10-21 DIAGNOSIS — R7303 Prediabetes: Secondary | ICD-10-CM | POA: Diagnosis not present

## 2016-10-21 DIAGNOSIS — M8580 Other specified disorders of bone density and structure, unspecified site: Secondary | ICD-10-CM | POA: Diagnosis not present

## 2016-10-21 DIAGNOSIS — Z9049 Acquired absence of other specified parts of digestive tract: Secondary | ICD-10-CM | POA: Diagnosis not present

## 2016-10-21 DIAGNOSIS — I10 Essential (primary) hypertension: Secondary | ICD-10-CM | POA: Diagnosis not present

## 2016-10-21 DIAGNOSIS — E785 Hyperlipidemia, unspecified: Secondary | ICD-10-CM | POA: Diagnosis not present

## 2016-11-21 DIAGNOSIS — Z23 Encounter for immunization: Secondary | ICD-10-CM | POA: Diagnosis not present

## 2016-11-21 DIAGNOSIS — Z1211 Encounter for screening for malignant neoplasm of colon: Secondary | ICD-10-CM | POA: Diagnosis not present

## 2016-11-21 DIAGNOSIS — M8588 Other specified disorders of bone density and structure, other site: Secondary | ICD-10-CM | POA: Diagnosis not present

## 2016-11-21 DIAGNOSIS — N760 Acute vaginitis: Secondary | ICD-10-CM | POA: Diagnosis not present

## 2016-11-21 DIAGNOSIS — I10 Essential (primary) hypertension: Secondary | ICD-10-CM | POA: Diagnosis not present

## 2016-11-21 DIAGNOSIS — Z1159 Encounter for screening for other viral diseases: Secondary | ICD-10-CM | POA: Diagnosis not present

## 2016-11-21 DIAGNOSIS — J4520 Mild intermittent asthma, uncomplicated: Secondary | ICD-10-CM | POA: Diagnosis not present

## 2016-11-21 DIAGNOSIS — M8580 Other specified disorders of bone density and structure, unspecified site: Secondary | ICD-10-CM | POA: Diagnosis not present

## 2016-11-21 DIAGNOSIS — R3915 Urgency of urination: Secondary | ICD-10-CM | POA: Diagnosis not present

## 2016-11-21 DIAGNOSIS — Z0000 Encounter for general adult medical examination without abnormal findings: Secondary | ICD-10-CM | POA: Diagnosis not present

## 2016-11-21 DIAGNOSIS — R7303 Prediabetes: Secondary | ICD-10-CM | POA: Diagnosis not present

## 2016-11-21 DIAGNOSIS — E785 Hyperlipidemia, unspecified: Secondary | ICD-10-CM | POA: Diagnosis not present

## 2016-11-26 DIAGNOSIS — R21 Rash and other nonspecific skin eruption: Secondary | ICD-10-CM | POA: Diagnosis not present

## 2017-01-24 ENCOUNTER — Other Ambulatory Visit (HOSPITAL_COMMUNITY)
Admission: RE | Admit: 2017-01-24 | Discharge: 2017-01-24 | Disposition: A | Discharge status: Home / Self Care | Payer: Medicare Other | Source: Ambulatory Visit | Attending: Internal Medicine | Admitting: Internal Medicine

## 2017-01-24 ENCOUNTER — Other Ambulatory Visit: Payer: Self-pay | Admitting: Internal Medicine

## 2017-01-24 DIAGNOSIS — Z1231 Encounter for screening mammogram for malignant neoplasm of breast: Secondary | ICD-10-CM | POA: Diagnosis not present

## 2017-01-24 DIAGNOSIS — Z124 Encounter for screening for malignant neoplasm of cervix: Secondary | ICD-10-CM | POA: Insufficient documentation

## 2017-01-24 DIAGNOSIS — J4520 Mild intermittent asthma, uncomplicated: Secondary | ICD-10-CM | POA: Diagnosis not present

## 2017-01-24 DIAGNOSIS — Z01419 Encounter for gynecological examination (general) (routine) without abnormal findings: Secondary | ICD-10-CM | POA: Diagnosis not present

## 2017-01-24 DIAGNOSIS — Z780 Asymptomatic menopausal state: Secondary | ICD-10-CM | POA: Diagnosis not present

## 2017-01-28 LAB — CYTOLOGY - PAP
Chlamydia: NEGATIVE
DIAGNOSIS: NEGATIVE
HPV: NOT DETECTED
NEISSERIA GONORRHEA: NEGATIVE

## 2017-03-20 DIAGNOSIS — J45901 Unspecified asthma with (acute) exacerbation: Secondary | ICD-10-CM | POA: Diagnosis not present

## 2017-04-08 DIAGNOSIS — M8588 Other specified disorders of bone density and structure, other site: Secondary | ICD-10-CM | POA: Diagnosis not present

## 2017-05-22 DIAGNOSIS — J4520 Mild intermittent asthma, uncomplicated: Secondary | ICD-10-CM | POA: Diagnosis not present

## 2017-05-22 DIAGNOSIS — E785 Hyperlipidemia, unspecified: Secondary | ICD-10-CM | POA: Diagnosis not present

## 2017-05-22 DIAGNOSIS — I10 Essential (primary) hypertension: Secondary | ICD-10-CM | POA: Diagnosis not present

## 2017-05-22 DIAGNOSIS — M8580 Other specified disorders of bone density and structure, unspecified site: Secondary | ICD-10-CM | POA: Diagnosis not present

## 2017-05-22 DIAGNOSIS — R7303 Prediabetes: Secondary | ICD-10-CM | POA: Diagnosis not present

## 2017-07-18 DIAGNOSIS — Z1231 Encounter for screening mammogram for malignant neoplasm of breast: Secondary | ICD-10-CM | POA: Diagnosis not present

## 2017-11-28 DIAGNOSIS — R7303 Prediabetes: Secondary | ICD-10-CM | POA: Diagnosis not present

## 2017-11-28 DIAGNOSIS — Z0000 Encounter for general adult medical examination without abnormal findings: Secondary | ICD-10-CM | POA: Diagnosis not present

## 2017-11-28 DIAGNOSIS — Z1239 Encounter for other screening for malignant neoplasm of breast: Secondary | ICD-10-CM | POA: Diagnosis not present

## 2017-11-28 DIAGNOSIS — K8020 Calculus of gallbladder without cholecystitis without obstruction: Secondary | ICD-10-CM | POA: Diagnosis not present

## 2017-11-28 DIAGNOSIS — I10 Essential (primary) hypertension: Secondary | ICD-10-CM | POA: Diagnosis not present

## 2017-11-28 DIAGNOSIS — J4520 Mild intermittent asthma, uncomplicated: Secondary | ICD-10-CM | POA: Diagnosis not present

## 2017-11-28 DIAGNOSIS — Z1211 Encounter for screening for malignant neoplasm of colon: Secondary | ICD-10-CM | POA: Diagnosis not present

## 2017-11-28 DIAGNOSIS — M8580 Other specified disorders of bone density and structure, unspecified site: Secondary | ICD-10-CM | POA: Diagnosis not present

## 2017-11-28 DIAGNOSIS — Z23 Encounter for immunization: Secondary | ICD-10-CM | POA: Diagnosis not present

## 2017-11-28 DIAGNOSIS — E785 Hyperlipidemia, unspecified: Secondary | ICD-10-CM | POA: Diagnosis not present

## 2017-11-28 DIAGNOSIS — Z1389 Encounter for screening for other disorder: Secondary | ICD-10-CM | POA: Diagnosis not present

## 2018-01-28 ENCOUNTER — Other Ambulatory Visit (HOSPITAL_COMMUNITY)
Admission: RE | Admit: 2018-01-28 | Discharge: 2018-01-28 | Disposition: A | Discharge status: Home / Self Care | Payer: Medicare Other | Source: Ambulatory Visit | Attending: Internal Medicine | Admitting: Internal Medicine

## 2018-01-28 ENCOUNTER — Other Ambulatory Visit: Payer: Self-pay | Admitting: Internal Medicine

## 2018-01-28 DIAGNOSIS — E876 Hypokalemia: Secondary | ICD-10-CM | POA: Diagnosis not present

## 2018-01-28 DIAGNOSIS — Z124 Encounter for screening for malignant neoplasm of cervix: Secondary | ICD-10-CM | POA: Insufficient documentation

## 2018-01-28 DIAGNOSIS — N760 Acute vaginitis: Secondary | ICD-10-CM | POA: Diagnosis not present

## 2018-01-28 DIAGNOSIS — Z01419 Encounter for gynecological examination (general) (routine) without abnormal findings: Secondary | ICD-10-CM | POA: Diagnosis not present

## 2018-01-28 DIAGNOSIS — Z79899 Other long term (current) drug therapy: Secondary | ICD-10-CM | POA: Diagnosis not present

## 2018-02-02 LAB — CYTOLOGY - PAP
Adequacy: ABSENT
Diagnosis: NEGATIVE

## 2018-06-02 DIAGNOSIS — I10 Essential (primary) hypertension: Secondary | ICD-10-CM | POA: Diagnosis not present

## 2018-06-02 DIAGNOSIS — E785 Hyperlipidemia, unspecified: Secondary | ICD-10-CM | POA: Diagnosis not present

## 2018-06-02 DIAGNOSIS — J4520 Mild intermittent asthma, uncomplicated: Secondary | ICD-10-CM | POA: Diagnosis not present

## 2018-06-02 DIAGNOSIS — R7303 Prediabetes: Secondary | ICD-10-CM | POA: Diagnosis not present

## 2018-06-04 DIAGNOSIS — R7303 Prediabetes: Secondary | ICD-10-CM | POA: Diagnosis not present

## 2018-11-11 DIAGNOSIS — G5711 Meralgia paresthetica, right lower limb: Secondary | ICD-10-CM | POA: Diagnosis not present

## 2018-11-11 DIAGNOSIS — Z23 Encounter for immunization: Secondary | ICD-10-CM | POA: Diagnosis not present

## 2018-12-01 DIAGNOSIS — E1165 Type 2 diabetes mellitus with hyperglycemia: Secondary | ICD-10-CM | POA: Diagnosis not present

## 2018-12-01 DIAGNOSIS — Z0000 Encounter for general adult medical examination without abnormal findings: Secondary | ICD-10-CM | POA: Diagnosis not present

## 2018-12-01 DIAGNOSIS — B373 Candidiasis of vulva and vagina: Secondary | ICD-10-CM | POA: Diagnosis not present

## 2018-12-01 DIAGNOSIS — J4520 Mild intermittent asthma, uncomplicated: Secondary | ICD-10-CM | POA: Diagnosis not present

## 2018-12-01 DIAGNOSIS — R1031 Right lower quadrant pain: Secondary | ICD-10-CM | POA: Diagnosis not present

## 2018-12-01 DIAGNOSIS — I10 Essential (primary) hypertension: Secondary | ICD-10-CM | POA: Diagnosis not present

## 2018-12-01 DIAGNOSIS — E785 Hyperlipidemia, unspecified: Secondary | ICD-10-CM | POA: Diagnosis not present

## 2018-12-01 DIAGNOSIS — Z1389 Encounter for screening for other disorder: Secondary | ICD-10-CM | POA: Diagnosis not present

## 2018-12-01 DIAGNOSIS — Z7984 Long term (current) use of oral hypoglycemic drugs: Secondary | ICD-10-CM | POA: Diagnosis not present

## 2019-03-16 DIAGNOSIS — J4520 Mild intermittent asthma, uncomplicated: Secondary | ICD-10-CM | POA: Diagnosis not present

## 2019-03-16 DIAGNOSIS — I10 Essential (primary) hypertension: Secondary | ICD-10-CM | POA: Diagnosis not present

## 2019-03-16 DIAGNOSIS — E1165 Type 2 diabetes mellitus with hyperglycemia: Secondary | ICD-10-CM | POA: Diagnosis not present

## 2019-03-18 DIAGNOSIS — Z23 Encounter for immunization: Secondary | ICD-10-CM | POA: Diagnosis not present

## 2019-03-18 DIAGNOSIS — Z1231 Encounter for screening mammogram for malignant neoplasm of breast: Secondary | ICD-10-CM | POA: Diagnosis not present

## 2019-07-07 DIAGNOSIS — I10 Essential (primary) hypertension: Secondary | ICD-10-CM | POA: Diagnosis not present

## 2019-07-07 DIAGNOSIS — J4520 Mild intermittent asthma, uncomplicated: Secondary | ICD-10-CM | POA: Diagnosis not present

## 2019-07-07 DIAGNOSIS — E785 Hyperlipidemia, unspecified: Secondary | ICD-10-CM | POA: Diagnosis not present

## 2019-07-07 DIAGNOSIS — E1165 Type 2 diabetes mellitus with hyperglycemia: Secondary | ICD-10-CM | POA: Diagnosis not present

## 2019-07-07 DIAGNOSIS — J45901 Unspecified asthma with (acute) exacerbation: Secondary | ICD-10-CM | POA: Diagnosis not present

## 2019-07-30 DIAGNOSIS — J4520 Mild intermittent asthma, uncomplicated: Secondary | ICD-10-CM | POA: Diagnosis not present

## 2019-07-30 DIAGNOSIS — E119 Type 2 diabetes mellitus without complications: Secondary | ICD-10-CM | POA: Diagnosis not present

## 2019-07-30 DIAGNOSIS — E785 Hyperlipidemia, unspecified: Secondary | ICD-10-CM | POA: Diagnosis not present

## 2019-07-30 DIAGNOSIS — Z7984 Long term (current) use of oral hypoglycemic drugs: Secondary | ICD-10-CM | POA: Diagnosis not present

## 2019-07-30 DIAGNOSIS — E1165 Type 2 diabetes mellitus with hyperglycemia: Secondary | ICD-10-CM | POA: Diagnosis not present

## 2019-07-30 DIAGNOSIS — I10 Essential (primary) hypertension: Secondary | ICD-10-CM | POA: Diagnosis not present

## 2019-08-04 DIAGNOSIS — J4520 Mild intermittent asthma, uncomplicated: Secondary | ICD-10-CM | POA: Diagnosis not present

## 2019-08-04 DIAGNOSIS — J45901 Unspecified asthma with (acute) exacerbation: Secondary | ICD-10-CM | POA: Diagnosis not present

## 2019-08-04 DIAGNOSIS — E785 Hyperlipidemia, unspecified: Secondary | ICD-10-CM | POA: Diagnosis not present

## 2019-08-04 DIAGNOSIS — E1165 Type 2 diabetes mellitus with hyperglycemia: Secondary | ICD-10-CM | POA: Diagnosis not present

## 2019-08-04 DIAGNOSIS — I10 Essential (primary) hypertension: Secondary | ICD-10-CM | POA: Diagnosis not present

## 2019-09-07 DIAGNOSIS — E785 Hyperlipidemia, unspecified: Secondary | ICD-10-CM | POA: Diagnosis not present

## 2019-09-07 DIAGNOSIS — J45901 Unspecified asthma with (acute) exacerbation: Secondary | ICD-10-CM | POA: Diagnosis not present

## 2019-09-07 DIAGNOSIS — J4520 Mild intermittent asthma, uncomplicated: Secondary | ICD-10-CM | POA: Diagnosis not present

## 2019-09-07 DIAGNOSIS — Z7984 Long term (current) use of oral hypoglycemic drugs: Secondary | ICD-10-CM | POA: Diagnosis not present

## 2019-09-07 DIAGNOSIS — E1165 Type 2 diabetes mellitus with hyperglycemia: Secondary | ICD-10-CM | POA: Diagnosis not present

## 2019-09-07 DIAGNOSIS — I10 Essential (primary) hypertension: Secondary | ICD-10-CM | POA: Diagnosis not present

## 2019-10-07 DIAGNOSIS — E785 Hyperlipidemia, unspecified: Secondary | ICD-10-CM | POA: Diagnosis not present

## 2019-10-07 DIAGNOSIS — J4520 Mild intermittent asthma, uncomplicated: Secondary | ICD-10-CM | POA: Diagnosis not present

## 2019-10-07 DIAGNOSIS — E1165 Type 2 diabetes mellitus with hyperglycemia: Secondary | ICD-10-CM | POA: Diagnosis not present

## 2019-10-07 DIAGNOSIS — J45901 Unspecified asthma with (acute) exacerbation: Secondary | ICD-10-CM | POA: Diagnosis not present

## 2019-10-07 DIAGNOSIS — I10 Essential (primary) hypertension: Secondary | ICD-10-CM | POA: Diagnosis not present

## 2019-10-14 DIAGNOSIS — E785 Hyperlipidemia, unspecified: Secondary | ICD-10-CM | POA: Diagnosis not present

## 2019-10-14 DIAGNOSIS — I10 Essential (primary) hypertension: Secondary | ICD-10-CM | POA: Diagnosis not present

## 2019-10-14 DIAGNOSIS — J45901 Unspecified asthma with (acute) exacerbation: Secondary | ICD-10-CM | POA: Diagnosis not present

## 2019-10-14 DIAGNOSIS — E1165 Type 2 diabetes mellitus with hyperglycemia: Secondary | ICD-10-CM | POA: Diagnosis not present

## 2019-10-14 DIAGNOSIS — J4520 Mild intermittent asthma, uncomplicated: Secondary | ICD-10-CM | POA: Diagnosis not present

## 2019-12-06 DIAGNOSIS — J4520 Mild intermittent asthma, uncomplicated: Secondary | ICD-10-CM | POA: Diagnosis not present

## 2019-12-06 DIAGNOSIS — J45901 Unspecified asthma with (acute) exacerbation: Secondary | ICD-10-CM | POA: Diagnosis not present

## 2019-12-06 DIAGNOSIS — E785 Hyperlipidemia, unspecified: Secondary | ICD-10-CM | POA: Diagnosis not present

## 2019-12-06 DIAGNOSIS — I10 Essential (primary) hypertension: Secondary | ICD-10-CM | POA: Diagnosis not present

## 2019-12-06 DIAGNOSIS — E1165 Type 2 diabetes mellitus with hyperglycemia: Secondary | ICD-10-CM | POA: Diagnosis not present

## 2019-12-07 DIAGNOSIS — Z0000 Encounter for general adult medical examination without abnormal findings: Secondary | ICD-10-CM | POA: Diagnosis not present

## 2019-12-07 DIAGNOSIS — E1165 Type 2 diabetes mellitus with hyperglycemia: Secondary | ICD-10-CM | POA: Diagnosis not present

## 2019-12-07 DIAGNOSIS — Z7984 Long term (current) use of oral hypoglycemic drugs: Secondary | ICD-10-CM | POA: Diagnosis not present

## 2019-12-07 DIAGNOSIS — Z7189 Other specified counseling: Secondary | ICD-10-CM | POA: Diagnosis not present

## 2019-12-07 DIAGNOSIS — Z1389 Encounter for screening for other disorder: Secondary | ICD-10-CM | POA: Diagnosis not present

## 2019-12-07 DIAGNOSIS — J4520 Mild intermittent asthma, uncomplicated: Secondary | ICD-10-CM | POA: Diagnosis not present

## 2019-12-07 DIAGNOSIS — M85859 Other specified disorders of bone density and structure, unspecified thigh: Secondary | ICD-10-CM | POA: Diagnosis not present

## 2019-12-07 DIAGNOSIS — E785 Hyperlipidemia, unspecified: Secondary | ICD-10-CM | POA: Diagnosis not present

## 2019-12-07 DIAGNOSIS — I10 Essential (primary) hypertension: Secondary | ICD-10-CM | POA: Diagnosis not present

## 2020-03-06 DIAGNOSIS — E1165 Type 2 diabetes mellitus with hyperglycemia: Secondary | ICD-10-CM | POA: Diagnosis not present

## 2020-03-06 DIAGNOSIS — E785 Hyperlipidemia, unspecified: Secondary | ICD-10-CM | POA: Diagnosis not present

## 2020-03-06 DIAGNOSIS — J4520 Mild intermittent asthma, uncomplicated: Secondary | ICD-10-CM | POA: Diagnosis not present

## 2020-03-06 DIAGNOSIS — J45901 Unspecified asthma with (acute) exacerbation: Secondary | ICD-10-CM | POA: Diagnosis not present

## 2020-03-06 DIAGNOSIS — I10 Essential (primary) hypertension: Secondary | ICD-10-CM | POA: Diagnosis not present

## 2020-03-08 DIAGNOSIS — M85851 Other specified disorders of bone density and structure, right thigh: Secondary | ICD-10-CM | POA: Diagnosis not present

## 2020-03-08 DIAGNOSIS — Z780 Asymptomatic menopausal state: Secondary | ICD-10-CM | POA: Diagnosis not present

## 2020-03-15 DIAGNOSIS — E1165 Type 2 diabetes mellitus with hyperglycemia: Secondary | ICD-10-CM | POA: Diagnosis not present

## 2020-03-15 DIAGNOSIS — J45901 Unspecified asthma with (acute) exacerbation: Secondary | ICD-10-CM | POA: Diagnosis not present

## 2020-03-15 DIAGNOSIS — I10 Essential (primary) hypertension: Secondary | ICD-10-CM | POA: Diagnosis not present

## 2020-03-15 DIAGNOSIS — J4520 Mild intermittent asthma, uncomplicated: Secondary | ICD-10-CM | POA: Diagnosis not present

## 2020-03-15 DIAGNOSIS — E785 Hyperlipidemia, unspecified: Secondary | ICD-10-CM | POA: Diagnosis not present

## 2020-04-10 DIAGNOSIS — Z1231 Encounter for screening mammogram for malignant neoplasm of breast: Secondary | ICD-10-CM | POA: Diagnosis not present

## 2020-04-20 DIAGNOSIS — E785 Hyperlipidemia, unspecified: Secondary | ICD-10-CM | POA: Diagnosis not present

## 2020-04-20 DIAGNOSIS — I10 Essential (primary) hypertension: Secondary | ICD-10-CM | POA: Diagnosis not present

## 2020-04-20 DIAGNOSIS — J45901 Unspecified asthma with (acute) exacerbation: Secondary | ICD-10-CM | POA: Diagnosis not present

## 2020-04-20 DIAGNOSIS — J4520 Mild intermittent asthma, uncomplicated: Secondary | ICD-10-CM | POA: Diagnosis not present

## 2020-04-20 DIAGNOSIS — E1165 Type 2 diabetes mellitus with hyperglycemia: Secondary | ICD-10-CM | POA: Diagnosis not present

## 2020-05-25 DIAGNOSIS — J45901 Unspecified asthma with (acute) exacerbation: Secondary | ICD-10-CM | POA: Diagnosis not present

## 2020-05-25 DIAGNOSIS — I10 Essential (primary) hypertension: Secondary | ICD-10-CM | POA: Diagnosis not present

## 2020-05-25 DIAGNOSIS — E1165 Type 2 diabetes mellitus with hyperglycemia: Secondary | ICD-10-CM | POA: Diagnosis not present

## 2020-05-25 DIAGNOSIS — E785 Hyperlipidemia, unspecified: Secondary | ICD-10-CM | POA: Diagnosis not present

## 2020-05-25 DIAGNOSIS — J4520 Mild intermittent asthma, uncomplicated: Secondary | ICD-10-CM | POA: Diagnosis not present

## 2020-06-26 DIAGNOSIS — E1165 Type 2 diabetes mellitus with hyperglycemia: Secondary | ICD-10-CM | POA: Diagnosis not present

## 2020-06-26 DIAGNOSIS — E785 Hyperlipidemia, unspecified: Secondary | ICD-10-CM | POA: Diagnosis not present

## 2020-06-26 DIAGNOSIS — I10 Essential (primary) hypertension: Secondary | ICD-10-CM | POA: Diagnosis not present

## 2020-06-26 DIAGNOSIS — J45901 Unspecified asthma with (acute) exacerbation: Secondary | ICD-10-CM | POA: Diagnosis not present

## 2020-06-26 DIAGNOSIS — J4520 Mild intermittent asthma, uncomplicated: Secondary | ICD-10-CM | POA: Diagnosis not present

## 2020-07-04 DIAGNOSIS — J4520 Mild intermittent asthma, uncomplicated: Secondary | ICD-10-CM | POA: Diagnosis not present

## 2020-07-04 DIAGNOSIS — Z7984 Long term (current) use of oral hypoglycemic drugs: Secondary | ICD-10-CM | POA: Diagnosis not present

## 2020-07-04 DIAGNOSIS — E785 Hyperlipidemia, unspecified: Secondary | ICD-10-CM | POA: Diagnosis not present

## 2020-07-04 DIAGNOSIS — E1165 Type 2 diabetes mellitus with hyperglycemia: Secondary | ICD-10-CM | POA: Diagnosis not present

## 2020-07-04 DIAGNOSIS — I10 Essential (primary) hypertension: Secondary | ICD-10-CM | POA: Diagnosis not present

## 2020-07-31 DIAGNOSIS — E119 Type 2 diabetes mellitus without complications: Secondary | ICD-10-CM | POA: Diagnosis not present

## 2020-07-31 DIAGNOSIS — Z7984 Long term (current) use of oral hypoglycemic drugs: Secondary | ICD-10-CM | POA: Diagnosis not present

## 2020-08-11 DIAGNOSIS — J45901 Unspecified asthma with (acute) exacerbation: Secondary | ICD-10-CM | POA: Diagnosis not present

## 2020-08-11 DIAGNOSIS — J4520 Mild intermittent asthma, uncomplicated: Secondary | ICD-10-CM | POA: Diagnosis not present

## 2020-08-11 DIAGNOSIS — E1165 Type 2 diabetes mellitus with hyperglycemia: Secondary | ICD-10-CM | POA: Diagnosis not present

## 2020-08-11 DIAGNOSIS — E785 Hyperlipidemia, unspecified: Secondary | ICD-10-CM | POA: Diagnosis not present

## 2020-08-11 DIAGNOSIS — I10 Essential (primary) hypertension: Secondary | ICD-10-CM | POA: Diagnosis not present

## 2020-08-11 DIAGNOSIS — E119 Type 2 diabetes mellitus without complications: Secondary | ICD-10-CM | POA: Diagnosis not present

## 2020-10-17 DIAGNOSIS — J45901 Unspecified asthma with (acute) exacerbation: Secondary | ICD-10-CM | POA: Diagnosis not present

## 2020-10-17 DIAGNOSIS — E119 Type 2 diabetes mellitus without complications: Secondary | ICD-10-CM | POA: Diagnosis not present

## 2020-10-17 DIAGNOSIS — J4520 Mild intermittent asthma, uncomplicated: Secondary | ICD-10-CM | POA: Diagnosis not present

## 2020-10-17 DIAGNOSIS — E785 Hyperlipidemia, unspecified: Secondary | ICD-10-CM | POA: Diagnosis not present

## 2020-10-17 DIAGNOSIS — I10 Essential (primary) hypertension: Secondary | ICD-10-CM | POA: Diagnosis not present

## 2020-10-17 DIAGNOSIS — E1165 Type 2 diabetes mellitus with hyperglycemia: Secondary | ICD-10-CM | POA: Diagnosis not present

## 2020-11-17 DIAGNOSIS — I10 Essential (primary) hypertension: Secondary | ICD-10-CM | POA: Diagnosis not present

## 2020-11-17 DIAGNOSIS — E1165 Type 2 diabetes mellitus with hyperglycemia: Secondary | ICD-10-CM | POA: Diagnosis not present

## 2020-11-17 DIAGNOSIS — E119 Type 2 diabetes mellitus without complications: Secondary | ICD-10-CM | POA: Diagnosis not present

## 2020-11-17 DIAGNOSIS — E785 Hyperlipidemia, unspecified: Secondary | ICD-10-CM | POA: Diagnosis not present

## 2020-11-17 DIAGNOSIS — J4520 Mild intermittent asthma, uncomplicated: Secondary | ICD-10-CM | POA: Diagnosis not present

## 2020-11-17 DIAGNOSIS — J45901 Unspecified asthma with (acute) exacerbation: Secondary | ICD-10-CM | POA: Diagnosis not present

## 2020-12-11 DIAGNOSIS — Z1389 Encounter for screening for other disorder: Secondary | ICD-10-CM | POA: Diagnosis not present

## 2020-12-11 DIAGNOSIS — E785 Hyperlipidemia, unspecified: Secondary | ICD-10-CM | POA: Diagnosis not present

## 2020-12-11 DIAGNOSIS — Z0000 Encounter for general adult medical examination without abnormal findings: Secondary | ICD-10-CM | POA: Diagnosis not present

## 2020-12-11 DIAGNOSIS — I10 Essential (primary) hypertension: Secondary | ICD-10-CM | POA: Diagnosis not present

## 2020-12-11 DIAGNOSIS — B351 Tinea unguium: Secondary | ICD-10-CM | POA: Diagnosis not present

## 2020-12-11 DIAGNOSIS — E1165 Type 2 diabetes mellitus with hyperglycemia: Secondary | ICD-10-CM | POA: Diagnosis not present

## 2020-12-11 DIAGNOSIS — Z7984 Long term (current) use of oral hypoglycemic drugs: Secondary | ICD-10-CM | POA: Diagnosis not present

## 2020-12-11 DIAGNOSIS — J4520 Mild intermittent asthma, uncomplicated: Secondary | ICD-10-CM | POA: Diagnosis not present

## 2021-06-15 DIAGNOSIS — E1165 Type 2 diabetes mellitus with hyperglycemia: Secondary | ICD-10-CM | POA: Diagnosis not present

## 2021-06-15 DIAGNOSIS — I10 Essential (primary) hypertension: Secondary | ICD-10-CM | POA: Diagnosis not present

## 2021-06-15 DIAGNOSIS — D1723 Benign lipomatous neoplasm of skin and subcutaneous tissue of right leg: Secondary | ICD-10-CM | POA: Diagnosis not present

## 2021-07-16 DIAGNOSIS — I10 Essential (primary) hypertension: Secondary | ICD-10-CM | POA: Diagnosis not present

## 2021-07-16 DIAGNOSIS — E1165 Type 2 diabetes mellitus with hyperglycemia: Secondary | ICD-10-CM | POA: Diagnosis not present

## 2021-08-01 DIAGNOSIS — E119 Type 2 diabetes mellitus without complications: Secondary | ICD-10-CM | POA: Diagnosis not present

## 2021-12-10 DIAGNOSIS — Z135 Encounter for screening for eye and ear disorders: Secondary | ICD-10-CM | POA: Diagnosis not present

## 2021-12-10 DIAGNOSIS — H524 Presbyopia: Secondary | ICD-10-CM | POA: Diagnosis not present

## 2021-12-10 DIAGNOSIS — H43393 Other vitreous opacities, bilateral: Secondary | ICD-10-CM | POA: Diagnosis not present

## 2021-12-11 DIAGNOSIS — H524 Presbyopia: Secondary | ICD-10-CM | POA: Diagnosis not present

## 2021-12-11 DIAGNOSIS — H52221 Regular astigmatism, right eye: Secondary | ICD-10-CM | POA: Diagnosis not present

## 2021-12-17 DIAGNOSIS — Z1331 Encounter for screening for depression: Secondary | ICD-10-CM | POA: Diagnosis not present

## 2021-12-17 DIAGNOSIS — Z0000 Encounter for general adult medical examination without abnormal findings: Secondary | ICD-10-CM | POA: Diagnosis not present

## 2021-12-17 DIAGNOSIS — Z1211 Encounter for screening for malignant neoplasm of colon: Secondary | ICD-10-CM | POA: Diagnosis not present

## 2021-12-17 DIAGNOSIS — E1165 Type 2 diabetes mellitus with hyperglycemia: Secondary | ICD-10-CM | POA: Diagnosis not present

## 2021-12-17 DIAGNOSIS — I10 Essential (primary) hypertension: Secondary | ICD-10-CM | POA: Diagnosis not present

## 2021-12-17 DIAGNOSIS — J4520 Mild intermittent asthma, uncomplicated: Secondary | ICD-10-CM | POA: Diagnosis not present

## 2022-02-08 DIAGNOSIS — K080 Exfoliation of teeth due to systemic causes: Secondary | ICD-10-CM | POA: Diagnosis not present

## 2022-03-21 DIAGNOSIS — K080 Exfoliation of teeth due to systemic causes: Secondary | ICD-10-CM | POA: Diagnosis not present

## 2022-04-26 DIAGNOSIS — Z1231 Encounter for screening mammogram for malignant neoplasm of breast: Secondary | ICD-10-CM | POA: Diagnosis not present

## 2022-06-04 DIAGNOSIS — R0602 Shortness of breath: Secondary | ICD-10-CM | POA: Diagnosis not present

## 2022-06-04 DIAGNOSIS — J45901 Unspecified asthma with (acute) exacerbation: Secondary | ICD-10-CM | POA: Diagnosis not present

## 2022-06-04 DIAGNOSIS — E119 Type 2 diabetes mellitus without complications: Secondary | ICD-10-CM | POA: Diagnosis not present

## 2022-07-19 DIAGNOSIS — Z0000 Encounter for general adult medical examination without abnormal findings: Secondary | ICD-10-CM | POA: Diagnosis not present

## 2022-07-19 DIAGNOSIS — E1165 Type 2 diabetes mellitus with hyperglycemia: Secondary | ICD-10-CM | POA: Diagnosis not present

## 2022-07-19 DIAGNOSIS — Z23 Encounter for immunization: Secondary | ICD-10-CM | POA: Diagnosis not present

## 2022-07-19 DIAGNOSIS — I10 Essential (primary) hypertension: Secondary | ICD-10-CM | POA: Diagnosis not present

## 2022-07-19 DIAGNOSIS — E785 Hyperlipidemia, unspecified: Secondary | ICD-10-CM | POA: Diagnosis not present

## 2022-11-06 DIAGNOSIS — J45901 Unspecified asthma with (acute) exacerbation: Secondary | ICD-10-CM | POA: Diagnosis not present

## 2022-11-06 DIAGNOSIS — I10 Essential (primary) hypertension: Secondary | ICD-10-CM | POA: Diagnosis not present

## 2022-11-06 DIAGNOSIS — E1165 Type 2 diabetes mellitus with hyperglycemia: Secondary | ICD-10-CM | POA: Diagnosis not present

## 2022-11-19 DIAGNOSIS — J45901 Unspecified asthma with (acute) exacerbation: Secondary | ICD-10-CM | POA: Diagnosis not present

## 2022-11-19 DIAGNOSIS — Z23 Encounter for immunization: Secondary | ICD-10-CM | POA: Diagnosis not present

## 2022-11-19 DIAGNOSIS — I10 Essential (primary) hypertension: Secondary | ICD-10-CM | POA: Diagnosis not present

## 2022-12-20 DIAGNOSIS — Z0000 Encounter for general adult medical examination without abnormal findings: Secondary | ICD-10-CM | POA: Diagnosis not present

## 2022-12-20 DIAGNOSIS — E1165 Type 2 diabetes mellitus with hyperglycemia: Secondary | ICD-10-CM | POA: Diagnosis not present

## 2022-12-20 DIAGNOSIS — E119 Type 2 diabetes mellitus without complications: Secondary | ICD-10-CM | POA: Diagnosis not present

## 2022-12-20 DIAGNOSIS — I10 Essential (primary) hypertension: Secondary | ICD-10-CM | POA: Diagnosis not present

## 2022-12-20 DIAGNOSIS — Z23 Encounter for immunization: Secondary | ICD-10-CM | POA: Diagnosis not present

## 2022-12-20 DIAGNOSIS — E785 Hyperlipidemia, unspecified: Secondary | ICD-10-CM | POA: Diagnosis not present

## 2023-02-03 DIAGNOSIS — H40053 Ocular hypertension, bilateral: Secondary | ICD-10-CM | POA: Diagnosis not present

## 2023-02-03 DIAGNOSIS — H40013 Open angle with borderline findings, low risk, bilateral: Secondary | ICD-10-CM | POA: Diagnosis not present

## 2023-02-03 DIAGNOSIS — E119 Type 2 diabetes mellitus without complications: Secondary | ICD-10-CM | POA: Diagnosis not present

## 2023-02-03 DIAGNOSIS — H25813 Combined forms of age-related cataract, bilateral: Secondary | ICD-10-CM | POA: Diagnosis not present

## 2023-04-29 DIAGNOSIS — Z1231 Encounter for screening mammogram for malignant neoplasm of breast: Secondary | ICD-10-CM | POA: Diagnosis not present

## 2023-04-29 DIAGNOSIS — M85859 Other specified disorders of bone density and structure, unspecified thigh: Secondary | ICD-10-CM | POA: Diagnosis not present

## 2023-04-29 DIAGNOSIS — M8588 Other specified disorders of bone density and structure, other site: Secondary | ICD-10-CM | POA: Diagnosis not present

## 2023-07-01 DIAGNOSIS — E785 Hyperlipidemia, unspecified: Secondary | ICD-10-CM | POA: Diagnosis not present

## 2023-07-01 DIAGNOSIS — E1165 Type 2 diabetes mellitus with hyperglycemia: Secondary | ICD-10-CM | POA: Diagnosis not present

## 2023-07-01 DIAGNOSIS — Z0000 Encounter for general adult medical examination without abnormal findings: Secondary | ICD-10-CM | POA: Diagnosis not present

## 2023-07-01 DIAGNOSIS — J4520 Mild intermittent asthma, uncomplicated: Secondary | ICD-10-CM | POA: Diagnosis not present

## 2023-07-01 DIAGNOSIS — I10 Essential (primary) hypertension: Secondary | ICD-10-CM | POA: Diagnosis not present

## 2023-12-23 DIAGNOSIS — I10 Essential (primary) hypertension: Secondary | ICD-10-CM | POA: Diagnosis not present

## 2023-12-23 DIAGNOSIS — E1165 Type 2 diabetes mellitus with hyperglycemia: Secondary | ICD-10-CM | POA: Diagnosis not present

## 2023-12-23 DIAGNOSIS — Z23 Encounter for immunization: Secondary | ICD-10-CM | POA: Diagnosis not present

## 2023-12-23 DIAGNOSIS — Z1331 Encounter for screening for depression: Secondary | ICD-10-CM | POA: Diagnosis not present

## 2023-12-23 DIAGNOSIS — Z7189 Other specified counseling: Secondary | ICD-10-CM | POA: Diagnosis not present

## 2023-12-23 DIAGNOSIS — M85859 Other specified disorders of bone density and structure, unspecified thigh: Secondary | ICD-10-CM | POA: Diagnosis not present

## 2023-12-23 DIAGNOSIS — Z0000 Encounter for general adult medical examination without abnormal findings: Secondary | ICD-10-CM | POA: Diagnosis not present

## 2023-12-23 DIAGNOSIS — J4520 Mild intermittent asthma, uncomplicated: Secondary | ICD-10-CM | POA: Diagnosis not present
